# Patient Record
Sex: Female | Born: 1940 | Race: White | Hispanic: No | Smoking: Never smoker
Health system: Southern US, Community
[De-identification: ages and names within clinical notes are randomized; demographics above are authoritative.]

## PROBLEM LIST (undated history)

## (undated) DIAGNOSIS — M8580 Other specified disorders of bone density and structure, unspecified site: Secondary | ICD-10-CM

## (undated) DIAGNOSIS — H60543 Acute eczematoid otitis externa, bilateral: Secondary | ICD-10-CM

## (undated) DIAGNOSIS — K5790 Diverticulosis of intestine, part unspecified, without perforation or abscess without bleeding: Secondary | ICD-10-CM

## (undated) DIAGNOSIS — I63512 Cerebral infarction due to unspecified occlusion or stenosis of left middle cerebral artery: Secondary | ICD-10-CM

## (undated) DIAGNOSIS — Q2112 Patent foramen ovale: Secondary | ICD-10-CM

## (undated) DIAGNOSIS — M7062 Trochanteric bursitis, left hip: Secondary | ICD-10-CM

## (undated) DIAGNOSIS — M545 Low back pain, unspecified: Secondary | ICD-10-CM

## (undated) DIAGNOSIS — D071 Carcinoma in situ of vulva: Secondary | ICD-10-CM

## (undated) DIAGNOSIS — M79674 Pain in right toe(s): Secondary | ICD-10-CM

## (undated) DIAGNOSIS — I8390 Asymptomatic varicose veins of unspecified lower extremity: Secondary | ICD-10-CM

## (undated) DIAGNOSIS — I2510 Atherosclerotic heart disease of native coronary artery without angina pectoris: Secondary | ICD-10-CM

## (undated) DIAGNOSIS — I10 Essential (primary) hypertension: Secondary | ICD-10-CM

## (undated) DIAGNOSIS — M7061 Trochanteric bursitis, right hip: Secondary | ICD-10-CM

## (undated) DIAGNOSIS — F4329 Adjustment disorder with other symptoms: Secondary | ICD-10-CM

## (undated) DIAGNOSIS — G8929 Other chronic pain: Secondary | ICD-10-CM

## (undated) DIAGNOSIS — M1711 Unilateral primary osteoarthritis, right knee: Secondary | ICD-10-CM

## (undated) DIAGNOSIS — L659 Nonscarring hair loss, unspecified: Secondary | ICD-10-CM

## (undated) DIAGNOSIS — R0981 Nasal congestion: Secondary | ICD-10-CM

## (undated) DIAGNOSIS — L900 Lichen sclerosus et atrophicus: Secondary | ICD-10-CM

## (undated) DIAGNOSIS — J302 Other seasonal allergic rhinitis: Secondary | ICD-10-CM

## (undated) DIAGNOSIS — M792 Neuralgia and neuritis, unspecified: Secondary | ICD-10-CM

## (undated) DIAGNOSIS — M961 Postlaminectomy syndrome, not elsewhere classified: Secondary | ICD-10-CM

## (undated) DIAGNOSIS — F329 Major depressive disorder, single episode, unspecified: Secondary | ICD-10-CM

## (undated) DIAGNOSIS — I639 Cerebral infarction, unspecified: Secondary | ICD-10-CM

## (undated) DIAGNOSIS — L600 Ingrowing nail: Secondary | ICD-10-CM

## (undated) DIAGNOSIS — K5901 Slow transit constipation: Secondary | ICD-10-CM

## (undated) DIAGNOSIS — E559 Vitamin D deficiency, unspecified: Secondary | ICD-10-CM

## (undated) DIAGNOSIS — M5126 Other intervertebral disc displacement, lumbar region: Secondary | ICD-10-CM

## (undated) DIAGNOSIS — J3489 Other specified disorders of nose and nasal sinuses: Secondary | ICD-10-CM

## (undated) DIAGNOSIS — M79675 Pain in left toe(s): Secondary | ICD-10-CM

## (undated) DIAGNOSIS — C801 Malignant (primary) neoplasm, unspecified: Secondary | ICD-10-CM

## (undated) DIAGNOSIS — M1990 Unspecified osteoarthritis, unspecified site: Secondary | ICD-10-CM

## (undated) DIAGNOSIS — L710 Perioral dermatitis: Secondary | ICD-10-CM

## (undated) DIAGNOSIS — J3089 Other allergic rhinitis: Secondary | ICD-10-CM

## (undated) DIAGNOSIS — J189 Pneumonia, unspecified organism: Secondary | ICD-10-CM

## (undated) DIAGNOSIS — I214 Non-ST elevation (NSTEMI) myocardial infarction: Secondary | ICD-10-CM

## (undated) DIAGNOSIS — E7800 Pure hypercholesterolemia, unspecified: Secondary | ICD-10-CM

## (undated) DIAGNOSIS — F32A Depression, unspecified: Secondary | ICD-10-CM

## (undated) DIAGNOSIS — Q211 Atrial septal defect: Secondary | ICD-10-CM

## (undated) DIAGNOSIS — R278 Other lack of coordination: Secondary | ICD-10-CM

## (undated) DIAGNOSIS — F419 Anxiety disorder, unspecified: Secondary | ICD-10-CM

## (undated) DIAGNOSIS — M159 Polyosteoarthritis, unspecified: Secondary | ICD-10-CM

## (undated) DIAGNOSIS — Z8673 Personal history of transient ischemic attack (TIA), and cerebral infarction without residual deficits: Secondary | ICD-10-CM

## (undated) DIAGNOSIS — I739 Peripheral vascular disease, unspecified: Secondary | ICD-10-CM

## (undated) HISTORY — DX: Lichen sclerosus et atrophicus: L90.0

## (undated) HISTORY — DX: Personal history of transient ischemic attack (TIA), and cerebral infarction without residual deficits: Z86.73

## (undated) HISTORY — DX: Pain in left toe(s): M79.675

## (undated) HISTORY — DX: Pain in right toe(s): M79.674

## (undated) HISTORY — DX: Other specified disorders of bone density and structure, unspecified site: M85.80

## (undated) HISTORY — DX: Nonscarring hair loss, unspecified: L65.9

## (undated) HISTORY — DX: Unspecified osteoarthritis, unspecified site: M19.90

## (undated) HISTORY — DX: Cerebral infarction due to unspecified occlusion or stenosis of left middle cerebral artery: I63.512

## (undated) HISTORY — DX: Acute eczematoid otitis externa, bilateral: H60.543

## (undated) HISTORY — DX: Trochanteric bursitis, left hip: M70.62

## (undated) HISTORY — DX: Asymptomatic varicose veins of unspecified lower extremity: I83.90

## (undated) HISTORY — DX: Perioral dermatitis: L71.0

## (undated) HISTORY — DX: Other seasonal allergic rhinitis: J30.2

## (undated) HISTORY — DX: Other intervertebral disc displacement, lumbar region: M51.26

## (undated) HISTORY — DX: Vitamin D deficiency, unspecified: E55.9

## (undated) HISTORY — DX: Carcinoma in situ of vulva: D07.1

## (undated) HISTORY — DX: Postlaminectomy syndrome, not elsewhere classified: M96.1

## (undated) HISTORY — DX: Unilateral primary osteoarthritis, right knee: M17.11

## (undated) HISTORY — DX: Neuralgia and neuritis, unspecified: M79.2

## (undated) HISTORY — DX: Trochanteric bursitis, right hip: M70.61

## (undated) HISTORY — DX: Adjustment disorder with other symptoms: F43.29

## (undated) HISTORY — DX: Nasal congestion: R09.81

## (undated) HISTORY — DX: Ingrowing nail: L60.0

## (undated) HISTORY — DX: Other allergic rhinitis: J30.89

## (undated) HISTORY — DX: Polyosteoarthritis, unspecified: M15.9

## (undated) HISTORY — DX: Other lack of coordination: R27.8

## (undated) HISTORY — DX: Slow transit constipation: K59.01

## (undated) HISTORY — DX: Other specified disorders of nose and nasal sinuses: J34.89

## (undated) HISTORY — PX: BACK SURGERY: SHX140

---

## 1980-04-13 HISTORY — PX: VAGINAL HYSTERECTOMY: SUR661

## 1997-04-13 HISTORY — PX: CYST EXCISION: SHX5701

## 2012-04-13 DIAGNOSIS — I639 Cerebral infarction, unspecified: Secondary | ICD-10-CM

## 2012-04-13 HISTORY — DX: Cerebral infarction, unspecified: I63.9

## 2014-01-27 DIAGNOSIS — M8580 Other specified disorders of bone density and structure, unspecified site: Secondary | ICD-10-CM

## 2014-01-27 DIAGNOSIS — M1990 Unspecified osteoarthritis, unspecified site: Secondary | ICD-10-CM | POA: Insufficient documentation

## 2014-01-27 DIAGNOSIS — M51369 Other intervertebral disc degeneration, lumbar region without mention of lumbar back pain or lower extremity pain: Secondary | ICD-10-CM

## 2014-01-27 DIAGNOSIS — M5126 Other intervertebral disc displacement, lumbar region: Secondary | ICD-10-CM

## 2014-01-27 DIAGNOSIS — I8390 Asymptomatic varicose veins of unspecified lower extremity: Secondary | ICD-10-CM

## 2014-01-27 DIAGNOSIS — M792 Neuralgia and neuritis, unspecified: Secondary | ICD-10-CM

## 2014-01-27 DIAGNOSIS — F4329 Adjustment disorder with other symptoms: Secondary | ICD-10-CM

## 2014-01-27 DIAGNOSIS — M961 Postlaminectomy syndrome, not elsewhere classified: Secondary | ICD-10-CM

## 2014-01-27 DIAGNOSIS — L659 Nonscarring hair loss, unspecified: Secondary | ICD-10-CM | POA: Insufficient documentation

## 2014-01-27 DIAGNOSIS — L900 Lichen sclerosus et atrophicus: Secondary | ICD-10-CM

## 2014-01-27 DIAGNOSIS — M5136 Other intervertebral disc degeneration, lumbar region: Secondary | ICD-10-CM

## 2014-01-27 DIAGNOSIS — D071 Carcinoma in situ of vulva: Secondary | ICD-10-CM

## 2014-01-27 DIAGNOSIS — M1711 Unilateral primary osteoarthritis, right knee: Secondary | ICD-10-CM

## 2014-01-27 DIAGNOSIS — E559 Vitamin D deficiency, unspecified: Secondary | ICD-10-CM

## 2014-01-27 HISTORY — DX: Postlaminectomy syndrome, not elsewhere classified: M96.1

## 2014-01-27 HISTORY — DX: Asymptomatic varicose veins of unspecified lower extremity: I83.90

## 2014-01-27 HISTORY — DX: Other intervertebral disc degeneration, lumbar region: M51.36

## 2014-01-27 HISTORY — DX: Other intervertebral disc degeneration, lumbar region without mention of lumbar back pain or lower extremity pain: M51.369

## 2014-01-27 HISTORY — DX: Other intervertebral disc displacement, lumbar region: M51.26

## 2014-01-27 HISTORY — DX: Neuralgia and neuritis, unspecified: M79.2

## 2014-01-27 HISTORY — DX: Unspecified osteoarthritis, unspecified site: M19.90

## 2014-01-27 HISTORY — DX: Other specified disorders of bone density and structure, unspecified site: M85.80

## 2014-01-27 HISTORY — DX: Unilateral primary osteoarthritis, right knee: M17.11

## 2014-01-27 HISTORY — DX: Adjustment disorder with other symptoms: F43.29

## 2014-01-27 HISTORY — DX: Carcinoma in situ of vulva: D07.1

## 2014-01-27 HISTORY — DX: Vitamin D deficiency, unspecified: E55.9

## 2014-01-27 HISTORY — DX: Nonscarring hair loss, unspecified: L65.9

## 2014-01-27 HISTORY — DX: Lichen sclerosus et atrophicus: L90.0

## 2014-07-17 DIAGNOSIS — M159 Polyosteoarthritis, unspecified: Secondary | ICD-10-CM | POA: Insufficient documentation

## 2014-07-17 DIAGNOSIS — M7062 Trochanteric bursitis, left hip: Secondary | ICD-10-CM

## 2014-07-17 DIAGNOSIS — J3089 Other allergic rhinitis: Secondary | ICD-10-CM

## 2014-07-17 DIAGNOSIS — M7061 Trochanteric bursitis, right hip: Secondary | ICD-10-CM

## 2014-07-17 DIAGNOSIS — Z8673 Personal history of transient ischemic attack (TIA), and cerebral infarction without residual deficits: Secondary | ICD-10-CM

## 2014-07-17 DIAGNOSIS — J302 Other seasonal allergic rhinitis: Secondary | ICD-10-CM

## 2014-07-17 DIAGNOSIS — K5901 Slow transit constipation: Secondary | ICD-10-CM

## 2014-07-17 HISTORY — DX: Trochanteric bursitis, left hip: M70.62

## 2014-07-17 HISTORY — DX: Polyosteoarthritis, unspecified: M15.9

## 2014-07-17 HISTORY — DX: Other seasonal allergic rhinitis: J30.2

## 2014-07-17 HISTORY — DX: Slow transit constipation: K59.01

## 2014-07-17 HISTORY — DX: Personal history of transient ischemic attack (TIA), and cerebral infarction without residual deficits: Z86.73

## 2014-07-17 HISTORY — DX: Trochanteric bursitis, right hip: M70.61

## 2015-05-31 DIAGNOSIS — M159 Polyosteoarthritis, unspecified: Secondary | ICD-10-CM | POA: Diagnosis not present

## 2015-05-31 DIAGNOSIS — I10 Essential (primary) hypertension: Secondary | ICD-10-CM | POA: Diagnosis not present

## 2015-05-31 DIAGNOSIS — I83893 Varicose veins of bilateral lower extremities with other complications: Secondary | ICD-10-CM | POA: Diagnosis not present

## 2015-08-30 DIAGNOSIS — M150 Primary generalized (osteo)arthritis: Secondary | ICD-10-CM | POA: Diagnosis not present

## 2015-08-30 DIAGNOSIS — M5126 Other intervertebral disc displacement, lumbar region: Secondary | ICD-10-CM | POA: Diagnosis not present

## 2015-08-30 DIAGNOSIS — E559 Vitamin D deficiency, unspecified: Secondary | ICD-10-CM | POA: Diagnosis not present

## 2015-08-30 DIAGNOSIS — M159 Polyosteoarthritis, unspecified: Secondary | ICD-10-CM | POA: Diagnosis not present

## 2015-08-30 DIAGNOSIS — E7800 Pure hypercholesterolemia, unspecified: Secondary | ICD-10-CM | POA: Diagnosis not present

## 2015-08-30 DIAGNOSIS — I10 Essential (primary) hypertension: Secondary | ICD-10-CM | POA: Diagnosis not present

## 2015-09-12 DIAGNOSIS — E7800 Pure hypercholesterolemia, unspecified: Secondary | ICD-10-CM | POA: Diagnosis not present

## 2015-09-12 DIAGNOSIS — E559 Vitamin D deficiency, unspecified: Secondary | ICD-10-CM | POA: Diagnosis not present

## 2015-09-12 DIAGNOSIS — I10 Essential (primary) hypertension: Secondary | ICD-10-CM | POA: Diagnosis not present

## 2015-10-01 DIAGNOSIS — H25012 Cortical age-related cataract, left eye: Secondary | ICD-10-CM | POA: Diagnosis not present

## 2015-10-01 DIAGNOSIS — H25011 Cortical age-related cataract, right eye: Secondary | ICD-10-CM | POA: Diagnosis not present

## 2015-10-01 DIAGNOSIS — H2513 Age-related nuclear cataract, bilateral: Secondary | ICD-10-CM | POA: Diagnosis not present

## 2015-10-01 DIAGNOSIS — H353131 Nonexudative age-related macular degeneration, bilateral, early dry stage: Secondary | ICD-10-CM | POA: Diagnosis not present

## 2016-01-06 DIAGNOSIS — E782 Mixed hyperlipidemia: Secondary | ICD-10-CM | POA: Diagnosis not present

## 2016-01-06 DIAGNOSIS — M1711 Unilateral primary osteoarthritis, right knee: Secondary | ICD-10-CM | POA: Diagnosis not present

## 2016-01-06 DIAGNOSIS — M150 Primary generalized (osteo)arthritis: Secondary | ICD-10-CM | POA: Diagnosis not present

## 2016-01-06 DIAGNOSIS — I10 Essential (primary) hypertension: Secondary | ICD-10-CM | POA: Diagnosis not present

## 2016-01-06 DIAGNOSIS — M159 Polyosteoarthritis, unspecified: Secondary | ICD-10-CM | POA: Diagnosis not present

## 2016-01-06 DIAGNOSIS — Z23 Encounter for immunization: Secondary | ICD-10-CM | POA: Diagnosis not present

## 2016-02-05 DIAGNOSIS — D071 Carcinoma in situ of vulva: Secondary | ICD-10-CM | POA: Diagnosis not present

## 2016-02-05 DIAGNOSIS — Z01419 Encounter for gynecological examination (general) (routine) without abnormal findings: Secondary | ICD-10-CM | POA: Diagnosis not present

## 2016-02-06 DIAGNOSIS — J069 Acute upper respiratory infection, unspecified: Secondary | ICD-10-CM | POA: Diagnosis not present

## 2016-02-10 DIAGNOSIS — L821 Other seborrheic keratosis: Secondary | ICD-10-CM | POA: Diagnosis not present

## 2016-02-10 DIAGNOSIS — L570 Actinic keratosis: Secondary | ICD-10-CM | POA: Diagnosis not present

## 2016-02-10 DIAGNOSIS — R233 Spontaneous ecchymoses: Secondary | ICD-10-CM | POA: Diagnosis not present

## 2016-02-27 DIAGNOSIS — I63512 Cerebral infarction due to unspecified occlusion or stenosis of left middle cerebral artery: Secondary | ICD-10-CM

## 2016-02-27 DIAGNOSIS — R278 Other lack of coordination: Secondary | ICD-10-CM

## 2016-02-27 DIAGNOSIS — I10 Essential (primary) hypertension: Secondary | ICD-10-CM | POA: Diagnosis not present

## 2016-02-27 HISTORY — DX: Cerebral infarction due to unspecified occlusion or stenosis of left middle cerebral artery: I63.512

## 2016-02-27 HISTORY — DX: Other lack of coordination: R27.8

## 2016-03-17 DIAGNOSIS — Z1231 Encounter for screening mammogram for malignant neoplasm of breast: Secondary | ICD-10-CM | POA: Diagnosis not present

## 2016-04-26 DIAGNOSIS — H6121 Impacted cerumen, right ear: Secondary | ICD-10-CM | POA: Diagnosis not present

## 2016-04-26 DIAGNOSIS — J0100 Acute maxillary sinusitis, unspecified: Secondary | ICD-10-CM | POA: Diagnosis not present

## 2016-05-12 DIAGNOSIS — I10 Essential (primary) hypertension: Secondary | ICD-10-CM | POA: Diagnosis not present

## 2016-05-12 DIAGNOSIS — J40 Bronchitis, not specified as acute or chronic: Secondary | ICD-10-CM | POA: Diagnosis not present

## 2016-05-12 DIAGNOSIS — E559 Vitamin D deficiency, unspecified: Secondary | ICD-10-CM | POA: Diagnosis not present

## 2016-05-12 DIAGNOSIS — J329 Chronic sinusitis, unspecified: Secondary | ICD-10-CM | POA: Diagnosis not present

## 2016-05-12 DIAGNOSIS — J309 Allergic rhinitis, unspecified: Secondary | ICD-10-CM | POA: Diagnosis not present

## 2016-05-12 DIAGNOSIS — M159 Polyosteoarthritis, unspecified: Secondary | ICD-10-CM | POA: Diagnosis not present

## 2016-05-12 DIAGNOSIS — E782 Mixed hyperlipidemia: Secondary | ICD-10-CM | POA: Diagnosis not present

## 2016-06-03 DIAGNOSIS — J309 Allergic rhinitis, unspecified: Secondary | ICD-10-CM | POA: Diagnosis not present

## 2016-06-03 DIAGNOSIS — J320 Chronic maxillary sinusitis: Secondary | ICD-10-CM | POA: Diagnosis not present

## 2016-06-03 DIAGNOSIS — J4520 Mild intermittent asthma, uncomplicated: Secondary | ICD-10-CM | POA: Diagnosis not present

## 2016-06-17 DIAGNOSIS — I63512 Cerebral infarction due to unspecified occlusion or stenosis of left middle cerebral artery: Secondary | ICD-10-CM | POA: Diagnosis not present

## 2016-06-30 DIAGNOSIS — H60543 Acute eczematoid otitis externa, bilateral: Secondary | ICD-10-CM

## 2016-06-30 DIAGNOSIS — R0981 Nasal congestion: Secondary | ICD-10-CM

## 2016-06-30 DIAGNOSIS — J3489 Other specified disorders of nose and nasal sinuses: Secondary | ICD-10-CM

## 2016-06-30 HISTORY — DX: Acute eczematoid otitis externa, bilateral: H60.543

## 2016-06-30 HISTORY — DX: Nasal congestion: R09.81

## 2016-06-30 HISTORY — DX: Other specified disorders of nose and nasal sinuses: J34.89

## 2016-08-17 DIAGNOSIS — M159 Polyosteoarthritis, unspecified: Secondary | ICD-10-CM | POA: Diagnosis not present

## 2016-08-17 DIAGNOSIS — E559 Vitamin D deficiency, unspecified: Secondary | ICD-10-CM | POA: Diagnosis not present

## 2016-08-17 DIAGNOSIS — E782 Mixed hyperlipidemia: Secondary | ICD-10-CM | POA: Diagnosis not present

## 2016-08-17 DIAGNOSIS — I10 Essential (primary) hypertension: Secondary | ICD-10-CM | POA: Diagnosis not present

## 2016-08-17 DIAGNOSIS — M5126 Other intervertebral disc displacement, lumbar region: Secondary | ICD-10-CM | POA: Diagnosis not present

## 2016-10-06 DIAGNOSIS — H2513 Age-related nuclear cataract, bilateral: Secondary | ICD-10-CM | POA: Diagnosis not present

## 2016-10-06 DIAGNOSIS — H25013 Cortical age-related cataract, bilateral: Secondary | ICD-10-CM | POA: Diagnosis not present

## 2016-10-06 DIAGNOSIS — H5203 Hypermetropia, bilateral: Secondary | ICD-10-CM | POA: Diagnosis not present

## 2016-10-06 DIAGNOSIS — H353131 Nonexudative age-related macular degeneration, bilateral, early dry stage: Secondary | ICD-10-CM | POA: Diagnosis not present

## 2016-10-06 DIAGNOSIS — H524 Presbyopia: Secondary | ICD-10-CM | POA: Diagnosis not present

## 2016-10-06 DIAGNOSIS — H35371 Puckering of macula, right eye: Secondary | ICD-10-CM | POA: Diagnosis not present

## 2016-11-09 DIAGNOSIS — H15102 Unspecified episcleritis, left eye: Secondary | ICD-10-CM | POA: Diagnosis not present

## 2016-11-19 DIAGNOSIS — H15102 Unspecified episcleritis, left eye: Secondary | ICD-10-CM | POA: Diagnosis not present

## 2016-11-25 DIAGNOSIS — E559 Vitamin D deficiency, unspecified: Secondary | ICD-10-CM | POA: Diagnosis not present

## 2016-11-25 DIAGNOSIS — R7301 Impaired fasting glucose: Secondary | ICD-10-CM | POA: Diagnosis not present

## 2016-11-25 DIAGNOSIS — M159 Polyosteoarthritis, unspecified: Secondary | ICD-10-CM | POA: Diagnosis not present

## 2016-11-25 DIAGNOSIS — I10 Essential (primary) hypertension: Secondary | ICD-10-CM | POA: Diagnosis not present

## 2016-11-25 DIAGNOSIS — E782 Mixed hyperlipidemia: Secondary | ICD-10-CM | POA: Diagnosis not present

## 2016-12-02 DIAGNOSIS — L710 Perioral dermatitis: Secondary | ICD-10-CM

## 2016-12-02 HISTORY — DX: Perioral dermatitis: L71.0

## 2016-12-21 DIAGNOSIS — H15103 Unspecified episcleritis, bilateral: Secondary | ICD-10-CM | POA: Diagnosis not present

## 2016-12-21 DIAGNOSIS — H16223 Keratoconjunctivitis sicca, not specified as Sjogren's, bilateral: Secondary | ICD-10-CM | POA: Diagnosis not present

## 2017-01-20 DIAGNOSIS — L219 Seborrheic dermatitis, unspecified: Secondary | ICD-10-CM | POA: Diagnosis not present

## 2017-01-20 DIAGNOSIS — L719 Rosacea, unspecified: Secondary | ICD-10-CM | POA: Diagnosis not present

## 2017-01-28 DIAGNOSIS — M5126 Other intervertebral disc displacement, lumbar region: Secondary | ICD-10-CM | POA: Diagnosis not present

## 2017-01-28 DIAGNOSIS — Z23 Encounter for immunization: Secondary | ICD-10-CM | POA: Diagnosis not present

## 2017-01-28 DIAGNOSIS — E782 Mixed hyperlipidemia: Secondary | ICD-10-CM | POA: Diagnosis not present

## 2017-01-28 DIAGNOSIS — M150 Primary generalized (osteo)arthritis: Secondary | ICD-10-CM | POA: Diagnosis not present

## 2017-01-28 DIAGNOSIS — I10 Essential (primary) hypertension: Secondary | ICD-10-CM | POA: Diagnosis not present

## 2017-01-29 DIAGNOSIS — Z23 Encounter for immunization: Secondary | ICD-10-CM | POA: Diagnosis not present

## 2017-02-04 DIAGNOSIS — S61240A Puncture wound with foreign body of right index finger without damage to nail, initial encounter: Secondary | ICD-10-CM | POA: Diagnosis not present

## 2017-02-10 DIAGNOSIS — L719 Rosacea, unspecified: Secondary | ICD-10-CM | POA: Diagnosis not present

## 2017-02-10 DIAGNOSIS — L219 Seborrheic dermatitis, unspecified: Secondary | ICD-10-CM | POA: Diagnosis not present

## 2017-03-08 DIAGNOSIS — M810 Age-related osteoporosis without current pathological fracture: Secondary | ICD-10-CM | POA: Diagnosis not present

## 2017-03-08 DIAGNOSIS — Z90711 Acquired absence of uterus with remaining cervical stump: Secondary | ICD-10-CM | POA: Diagnosis not present

## 2017-03-08 DIAGNOSIS — D071 Carcinoma in situ of vulva: Secondary | ICD-10-CM | POA: Diagnosis not present

## 2017-03-08 DIAGNOSIS — Z01419 Encounter for gynecological examination (general) (routine) without abnormal findings: Secondary | ICD-10-CM | POA: Diagnosis not present

## 2017-03-17 DIAGNOSIS — M79674 Pain in right toe(s): Secondary | ICD-10-CM

## 2017-03-17 DIAGNOSIS — L600 Ingrowing nail: Secondary | ICD-10-CM

## 2017-03-17 DIAGNOSIS — M79675 Pain in left toe(s): Secondary | ICD-10-CM | POA: Diagnosis not present

## 2017-03-17 HISTORY — DX: Pain in right toe(s): M79.674

## 2017-03-17 HISTORY — DX: Ingrowing nail: L60.0

## 2017-04-20 DIAGNOSIS — H35371 Puckering of macula, right eye: Secondary | ICD-10-CM | POA: Diagnosis not present

## 2017-04-20 DIAGNOSIS — H353131 Nonexudative age-related macular degeneration, bilateral, early dry stage: Secondary | ICD-10-CM | POA: Diagnosis not present

## 2017-05-07 DIAGNOSIS — Z780 Asymptomatic menopausal state: Secondary | ICD-10-CM | POA: Diagnosis not present

## 2017-05-07 DIAGNOSIS — Z1231 Encounter for screening mammogram for malignant neoplasm of breast: Secondary | ICD-10-CM | POA: Diagnosis not present

## 2017-05-07 DIAGNOSIS — M810 Age-related osteoporosis without current pathological fracture: Secondary | ICD-10-CM | POA: Diagnosis not present

## 2017-05-07 DIAGNOSIS — M8589 Other specified disorders of bone density and structure, multiple sites: Secondary | ICD-10-CM | POA: Diagnosis not present

## 2017-05-13 ENCOUNTER — Other Ambulatory Visit: Payer: Self-pay

## 2017-05-13 ENCOUNTER — Emergency Department (HOSPITAL_BASED_OUTPATIENT_CLINIC_OR_DEPARTMENT_OTHER): Payer: PPO

## 2017-05-13 ENCOUNTER — Inpatient Hospital Stay (HOSPITAL_BASED_OUTPATIENT_CLINIC_OR_DEPARTMENT_OTHER)
Admission: EM | Admit: 2017-05-13 | Discharge: 2017-05-15 | DRG: 247 | Disposition: A | Discharge status: Home / Self Care | Payer: PPO | Attending: Cardiovascular Disease | Admitting: Cardiovascular Disease

## 2017-05-13 ENCOUNTER — Encounter (HOSPITAL_BASED_OUTPATIENT_CLINIC_OR_DEPARTMENT_OTHER): Payer: Self-pay | Admitting: *Deleted

## 2017-05-13 DIAGNOSIS — Z955 Presence of coronary angioplasty implant and graft: Secondary | ICD-10-CM | POA: Diagnosis not present

## 2017-05-13 DIAGNOSIS — K5730 Diverticulosis of large intestine without perforation or abscess without bleeding: Secondary | ICD-10-CM | POA: Diagnosis present

## 2017-05-13 DIAGNOSIS — R0789 Other chest pain: Secondary | ICD-10-CM | POA: Diagnosis not present

## 2017-05-13 DIAGNOSIS — Q211 Atrial septal defect: Secondary | ICD-10-CM | POA: Diagnosis not present

## 2017-05-13 DIAGNOSIS — F419 Anxiety disorder, unspecified: Secondary | ICD-10-CM | POA: Diagnosis present

## 2017-05-13 DIAGNOSIS — I10 Essential (primary) hypertension: Secondary | ICD-10-CM | POA: Diagnosis present

## 2017-05-13 DIAGNOSIS — Z79899 Other long term (current) drug therapy: Secondary | ICD-10-CM

## 2017-05-13 DIAGNOSIS — E7800 Pure hypercholesterolemia, unspecified: Secondary | ICD-10-CM | POA: Diagnosis not present

## 2017-05-13 DIAGNOSIS — I214 Non-ST elevation (NSTEMI) myocardial infarction: Principal | ICD-10-CM | POA: Diagnosis present

## 2017-05-13 DIAGNOSIS — Z8673 Personal history of transient ischemic attack (TIA), and cerebral infarction without residual deficits: Secondary | ICD-10-CM | POA: Diagnosis not present

## 2017-05-13 DIAGNOSIS — Z79891 Long term (current) use of opiate analgesic: Secondary | ICD-10-CM

## 2017-05-13 DIAGNOSIS — I340 Nonrheumatic mitral (valve) insufficiency: Secondary | ICD-10-CM | POA: Diagnosis not present

## 2017-05-13 DIAGNOSIS — E876 Hypokalemia: Secondary | ICD-10-CM | POA: Diagnosis not present

## 2017-05-13 DIAGNOSIS — Z885 Allergy status to narcotic agent status: Secondary | ICD-10-CM | POA: Diagnosis not present

## 2017-05-13 DIAGNOSIS — R079 Chest pain, unspecified: Secondary | ICD-10-CM | POA: Diagnosis not present

## 2017-05-13 DIAGNOSIS — Z7982 Long term (current) use of aspirin: Secondary | ICD-10-CM

## 2017-05-13 DIAGNOSIS — Q2112 Patent foramen ovale: Secondary | ICD-10-CM

## 2017-05-13 DIAGNOSIS — R109 Unspecified abdominal pain: Secondary | ICD-10-CM | POA: Diagnosis not present

## 2017-05-13 DIAGNOSIS — Z888 Allergy status to other drugs, medicaments and biological substances status: Secondary | ICD-10-CM

## 2017-05-13 DIAGNOSIS — I2510 Atherosclerotic heart disease of native coronary artery without angina pectoris: Secondary | ICD-10-CM | POA: Diagnosis not present

## 2017-05-13 DIAGNOSIS — R011 Cardiac murmur, unspecified: Secondary | ICD-10-CM | POA: Diagnosis not present

## 2017-05-13 DIAGNOSIS — E785 Hyperlipidemia, unspecified: Secondary | ICD-10-CM | POA: Diagnosis present

## 2017-05-13 DIAGNOSIS — I739 Peripheral vascular disease, unspecified: Secondary | ICD-10-CM

## 2017-05-13 DIAGNOSIS — K5790 Diverticulosis of intestine, part unspecified, without perforation or abscess without bleeding: Secondary | ICD-10-CM | POA: Diagnosis present

## 2017-05-13 DIAGNOSIS — Z90710 Acquired absence of both cervix and uterus: Secondary | ICD-10-CM

## 2017-05-13 DIAGNOSIS — R0602 Shortness of breath: Secondary | ICD-10-CM | POA: Diagnosis not present

## 2017-05-13 HISTORY — DX: Diverticulosis of intestine, part unspecified, without perforation or abscess without bleeding: K57.90

## 2017-05-13 HISTORY — DX: Non-ST elevation (NSTEMI) myocardial infarction: I21.4

## 2017-05-13 HISTORY — DX: Patent foramen ovale: Q21.12

## 2017-05-13 HISTORY — DX: Atherosclerotic heart disease of native coronary artery without angina pectoris: I25.10

## 2017-05-13 HISTORY — DX: Atrial septal defect: Q21.1

## 2017-05-13 HISTORY — DX: Other chronic pain: G89.29

## 2017-05-13 HISTORY — DX: Peripheral vascular disease, unspecified: I73.9

## 2017-05-13 HISTORY — DX: Unspecified osteoarthritis, unspecified site: M19.90

## 2017-05-13 HISTORY — DX: Cerebral infarction, unspecified: I63.9

## 2017-05-13 HISTORY — DX: Depression, unspecified: F32.A

## 2017-05-13 HISTORY — DX: Essential (primary) hypertension: I10

## 2017-05-13 HISTORY — DX: Low back pain: M54.5

## 2017-05-13 HISTORY — DX: Low back pain, unspecified: M54.50

## 2017-05-13 HISTORY — DX: Pneumonia, unspecified organism: J18.9

## 2017-05-13 HISTORY — DX: Major depressive disorder, single episode, unspecified: F32.9

## 2017-05-13 HISTORY — DX: Asymptomatic varicose veins of unspecified lower extremity: I83.90

## 2017-05-13 HISTORY — DX: Malignant (primary) neoplasm, unspecified: C80.1

## 2017-05-13 HISTORY — DX: Pure hypercholesterolemia, unspecified: E78.00

## 2017-05-13 HISTORY — DX: Anxiety disorder, unspecified: F41.9

## 2017-05-13 LAB — CBC
HCT: 42.4 % (ref 36.0–46.0)
HEMOGLOBIN: 13.9 g/dL (ref 12.0–15.0)
MCH: 28.1 pg (ref 26.0–34.0)
MCHC: 32.8 g/dL (ref 30.0–36.0)
MCV: 85.8 fL (ref 78.0–100.0)
PLATELETS: 252 10*3/uL (ref 150–400)
RBC: 4.94 MIL/uL (ref 3.87–5.11)
RDW: 15.2 % (ref 11.5–15.5)
WBC: 10.3 10*3/uL (ref 4.0–10.5)

## 2017-05-13 LAB — BASIC METABOLIC PANEL
ANION GAP: 10 (ref 5–15)
BUN: 16 mg/dL (ref 6–20)
CALCIUM: 10.5 mg/dL — AB (ref 8.9–10.3)
CO2: 26 mmol/L (ref 22–32)
Chloride: 102 mmol/L (ref 101–111)
Creatinine, Ser: 0.79 mg/dL (ref 0.44–1.00)
Glucose, Bld: 120 mg/dL — ABNORMAL HIGH (ref 65–99)
Potassium: 3.9 mmol/L (ref 3.5–5.1)
SODIUM: 138 mmol/L (ref 135–145)

## 2017-05-13 LAB — TROPONIN I: TROPONIN I: 18.96 ng/mL — AB (ref <0.03)

## 2017-05-13 LAB — MRSA PCR SCREENING: MRSA BY PCR: NEGATIVE

## 2017-05-13 LAB — I-STAT CG4 LACTIC ACID, ED: LACTIC ACID, VENOUS: 1.5 mmol/L (ref 0.5–1.9)

## 2017-05-13 MED ORDER — LORATADINE 10 MG PO TABS
10.0000 mg | ORAL_TABLET | Freq: Every day | ORAL | Status: DC
  Administered 2017-05-14 – 2017-05-15 (×2): 10 mg via ORAL
  Filled 2017-05-13 (×3): qty 1

## 2017-05-13 MED ORDER — MECLIZINE HCL 12.5 MG PO TABS
12.5000 mg | ORAL_TABLET | Freq: Three times a day (TID) | ORAL | Status: DC | PRN
  Filled 2017-05-13: qty 1

## 2017-05-13 MED ORDER — HYDROCODONE-ACETAMINOPHEN 5-325 MG PO TABS
1.0000 | ORAL_TABLET | Freq: Four times a day (QID) | ORAL | Status: DC | PRN
  Administered 2017-05-14 – 2017-05-15 (×5): 1 via ORAL
  Filled 2017-05-13 (×5): qty 1

## 2017-05-13 MED ORDER — HEPARIN (PORCINE) IN NACL 100-0.45 UNIT/ML-% IJ SOLN
1000.0000 [IU]/h | INTRAMUSCULAR | Status: DC
  Administered 2017-05-13: 900 [IU]/h via INTRAVENOUS
  Filled 2017-05-13: qty 250

## 2017-05-13 MED ORDER — ONDANSETRON HCL 4 MG/2ML IJ SOLN: 4.0000 mg | Freq: Four times a day (QID) | INTRAMUSCULAR | Status: DC | PRN

## 2017-05-13 MED ORDER — ASPIRIN 81 MG PO CHEW
162.0000 mg | CHEWABLE_TABLET | Freq: Once | ORAL | Status: AC
  Administered 2017-05-13: 162 mg via ORAL
  Filled 2017-05-13: qty 2

## 2017-05-13 MED ORDER — NITROGLYCERIN 0.4 MG SL SUBL
0.4000 mg | SUBLINGUAL_TABLET | SUBLINGUAL | Status: DC | PRN
  Administered 2017-05-15: 0.4 mg via SUBLINGUAL
  Filled 2017-05-13: qty 1

## 2017-05-13 MED ORDER — ASPIRIN EC 81 MG PO TBEC: 81.0000 mg | DELAYED_RELEASE_TABLET | Freq: Every day | ORAL | Status: DC

## 2017-05-13 MED ORDER — DIAZEPAM 5 MG PO TABS
10.0000 mg | ORAL_TABLET | Freq: Four times a day (QID) | ORAL | Status: DC | PRN
  Administered 2017-05-14: 23:00:00 5 mg via ORAL
  Filled 2017-05-13 (×2): qty 2

## 2017-05-13 MED ORDER — ROSUVASTATIN CALCIUM 20 MG PO TABS
40.0000 mg | ORAL_TABLET | Freq: Every day | ORAL | Status: DC
  Administered 2017-05-14 (×2): 40 mg via ORAL
  Filled 2017-05-13 (×2): qty 2

## 2017-05-13 MED ORDER — HEPARIN BOLUS VIA INFUSION
4000.0000 [IU] | Freq: Once | INTRAVENOUS | Status: AC
  Administered 2017-05-13: 4000 [IU] via INTRAVENOUS

## 2017-05-13 MED ORDER — AMLODIPINE BESYLATE 5 MG PO TABS
5.0000 mg | ORAL_TABLET | Freq: Every day | ORAL | Status: DC
  Administered 2017-05-14 – 2017-05-15 (×2): 5 mg via ORAL
  Filled 2017-05-13 (×3): qty 1

## 2017-05-13 MED ORDER — ASPIRIN EC 81 MG PO TBEC
81.0000 mg | DELAYED_RELEASE_TABLET | Freq: Every day | ORAL | Status: DC
  Administered 2017-05-15: 81 mg via ORAL
  Filled 2017-05-13: qty 1

## 2017-05-13 MED ORDER — METOPROLOL TARTRATE 25 MG PO TABS
25.0000 mg | ORAL_TABLET | Freq: Two times a day (BID) | ORAL | Status: DC
  Administered 2017-05-14: 25 mg via ORAL
  Filled 2017-05-13 (×3): qty 1

## 2017-05-13 MED ORDER — IOPAMIDOL (ISOVUE-370) INJECTION 76%
100.0000 mL | Freq: Once | INTRAVENOUS | Status: AC | PRN
  Administered 2017-05-13: 100 mL via INTRAVENOUS

## 2017-05-13 MED ORDER — ACETAMINOPHEN 325 MG PO TABS: 650.0000 mg | ORAL_TABLET | ORAL | Status: DC | PRN

## 2017-05-13 MED ORDER — ENALAPRIL MALEATE 20 MG PO TABS
20.0000 mg | ORAL_TABLET | Freq: Every day | ORAL | Status: DC
  Administered 2017-05-14 – 2017-05-15 (×2): 20 mg via ORAL
  Filled 2017-05-13 (×3): qty 1

## 2017-05-13 NOTE — ED Provider Notes (Signed)
[facility] EMERGENCY DEPARTMENT Provider Note   CSN: [id number] Arrival date & time: 05/13/17  1449     History   Chief Complaint Chief Complaint  Patient presents with  . Chest Pain    HPI Solace Manwarren is a 77 y.o. female.  HPI   Patient is a 77 year old female presenting with chest pain.  Patient felt short sharp chest pain today at noon.  It radiated to her back and radiated to her right arm.  Chest pain went down her right arm and associated with shortness of breath and diaphoresis.  Patient felt that she had to take off her shirt because she got so sweaty.  Patient called EMS because she got short of breath additionally.  She was taken to [facility].  They had a 5-hour wait so she waited in the waiting room for a little period of time and then drove over here to our emergency department to be seen.  Patient has just low-grade chest pain at this point.  However she is tachypneic on arrival.  Patient has no recent travel recent surgeries or estrogen use.  However I am concerned because the chest pain radiated to her back.  Past Medical History:  Diagnosis Date  . Anxiety   . Cancer (HCC)    cervical (pre-cancerous)  . Chronic pain   . Diverticulosis   . Stroke ([place])   . Varicose vein of leg     There are no active problems to display for this patient.   Past Surgical History:  Procedure Laterality Date  . ABDOMINAL HYSTERECTOMY      OB History    No data available       Home Medications    Prior to Admission medications   Medication Sig Start Date End Date Taking? Authorizing Provider  amLODipine (NORVASC) 5 MG tablet Take 5 mg by mouth daily.   Yes [provider]  aspirin EC 81 MG tablet Take 81 mg by mouth daily.   Yes [provider]  diazepam (VALIUM) 10 MG tablet Take 10 mg by mouth every 6 (six) hours as needed for anxiety.   Yes [provider]  enalapril (VASOTEC) 20 MG tablet Take 20 mg by mouth  daily.   Yes [provider]  HYDROcodone-acetaminophen (NORCO/VICODIN) 5-325 MG tablet Take 1 tablet by mouth every 6 (six) hours as needed for moderate pain.   Yes [provider]  loratadine (CLARITIN) 10 MG tablet Take 10 mg by mouth daily.   Yes [provider]  meclizine (ANTIVERT) 12.5 MG tablet Take 12.5 mg by mouth 3 (three) times daily as needed for dizziness.   Yes [provider]  metoprolol tartrate (LOPRESSOR) 25 MG tablet Take 25 mg by mouth 2 (two) times daily.   Yes [provider]  Naproxen (NAPROSYN PO) Take by mouth.   Yes [provider]    Family History History reviewed. No pertinent family history.  Social History Social History   Tobacco Use  . Smoking status: Never Smoker  . Smokeless tobacco: Never Used  Substance Use Topics  . Alcohol use: No    Frequency: Never  . Drug use: No     Allergies   Codeine   Review of Systems Review of Systems  Constitutional: Negative for activity change.  Respiratory: Negative for shortness of breath.   Cardiovascular: Positive for chest pain.  Gastrointestinal: Negative for abdominal pain.  All other systems reviewed and are negative.  Physical Exam Updated Vital Signs BP (!) 154/79   Pulse 86   Temp 98.2 F (36.8 C)   Resp (!) 22   Ht 5\' 3"  (1.6 m)   Wt 81.6 kg (180 lb)   SpO2 100%   BMI 31.89 kg/m   Physical Exam  Constitutional: She is oriented to person, place, and time. She appears well-developed and well-nourished.  HENT:  Head: Normocephalic and atraumatic.  Eyes: Right eye exhibits no discharge.  Cardiovascular: Normal rate, regular rhythm and normal heart sounds.  No murmur heard. Pulmonary/Chest: Effort normal and breath sounds normal. She has no wheezes. She has no rales.  Abdominal: Soft. She exhibits no distension. There is no tenderness.  Neurological: She is oriented to person, place, and time.  Skin: Skin is warm and dry. She  is not diaphoretic.  Psychiatric: She has a normal mood and affect.  Nursing note and vitals reviewed.    ED Treatments / Results  Labs (all labs ordered are listed, but only abnormal results are displayed) Labs Reviewed  BASIC METABOLIC PANEL  CBC  TROPONIN I  I-STAT CG4 LACTIC ACID, ED    EKG  EKG Interpretation  Date/Time:  Thursday May 13 2017 14:58:37 EST Ventricular Rate:  75 PR Interval:  150 QRS Duration: 78 QT Interval:  382 QTC Calculation: 426 R Axis:   54 Text Interpretation:  Normal sinus rhythm Normal ECG no sig signs of ischemia. Confirmed by Zenovia Jarred [phone]) on 05/13/2017 4:51:03 PM       Radiology No results found.  Procedures Procedures (including critical care time)  CRITICAL CARE Performed by: Gardiner Sleeper Total critical care time: 60 minutes Critical care time was exclusive of separately billable procedures and treating other patients. Critical care was necessary to treat or prevent imminent or life-threatening deterioration. Critical care was time spent personally by me on the following activities: development of treatment plan with patient and/or surrogate as well as nursing, discussions with consultants, evaluation of patient's response to treatment, examination of patient, obtaining history from patient or surrogate, ordering and performing treatments and interventions, ordering and review of laboratory studies, ordering and review of radiographic studies, pulse oximetry and re-evaluation of patient's condition.   Medications Ordered in ED Medications - No data to display   Initial Impression / Assessment and Plan / ED Course  I have reviewed the triage vital signs and the nursing notes.  Pertinent labs & imaging results that were available during my care of the patient were reviewed by me and considered in my medical decision making (see chart for details).    Patient is a 77 year old female presenting with chest  pain.  Patient felt short sharp chest pain today at noon.  It radiated to her back and radiated to her right arm.  Chest pain went down her right arm and associated with shortness of breath and diaphoresis.  Patient felt that she had to take off her shirt because she got so sweaty.  Patient called EMS because she got short of breath additionally.  She was taken to [facility].  They had a 5-hour wait so she waited in the waiting room for a little period of time and then drove over here to our emergency department to be seen.  Patient has just low-grade chest pain at this point.  However she is tachypneic on arrival.  Patient has no recent travel recent surgeries or estrogen use.  However I am concerned because the chest pain radiated to her  back.   4:53 PM Will get CT dissection given the chest pain radiating to the back and the acute onset.  Concerned that this is a cardiac ischemia versus dissection.  Patient has history of hypertension hyperlipidemia.  Will admit for chest pain rule out  Elevated troponin. EKG non ischemic.    6:33 PM Was waiting for CT dissection study to come back negative to start heparin.  Admit step down Olivia Mackie.     Final Clinical Impressions(s) / ED Diagnoses   Final diagnoses:  None    ED Discharge Orders    None       Macarthur Critchley, MD 05/13/17 2304

## 2017-05-13 NOTE — ED Notes (Signed)
Spoke with Marden Noble from [place] and he is dispatching a truck

## 2017-05-13 NOTE — ED Notes (Signed)
[phone], Jeral Fruit, granddaughter

## 2017-05-13 NOTE — ED Notes (Signed)
EDP decided not to give NTG at this time.

## 2017-05-13 NOTE — Hospital Discharge Follow-Up (Signed)
History and Physical   Patient ID: Theresa Prince, MRN: [id number], DOB: 01-01-1941   Date of Encounter: 05/13/2017, 10:42 PM  Primary Care Provider: Manfred Shirts, PA Cardiologist: NA Electrophysiologist:  NA  Chief Complaint:  CP  History of Present Illness: Theresa Prince is a 77 y.o. female w/ no documented cardiac hx, but + risk factors of HTN, dyslipidemia and h/o CVA per the chart presents to the ED in [facility] earlier this evening c/o CP. She said earlier today around noon she had sudden onset of a midsternal chest discomfort that felt like "someone punching me in the chest." the pain was associated with SOB and some discomfort in her R arm. It did radiate to her back. It kept getting worse and worse to the point she felt she needed to go to the ED. Nothing in particular made the pain better at first, but ultimately she got NTG at the ED in [facility] which fully relieved the discomfort. She has never had anything like this in the past. She has been able to exert herself w/o any limiting cardiac sx before today.trop was abnormal at the outside ED, up to almost 19. She is pain-free at the time of my exam/interview.  Past Medical History:  Diagnosis Date  . Anxiety   . Arthritis    "all over" (05/13/2017)  . Cancer (HCC)    cervical (pre-cancerous)  . Chronic lower back pain   . Depression   . Diverticulosis   . High cholesterol   . Hypertension   . NSTEMI (non-ST elevated myocardial infarction) ([place]) 05/13/2017  . Pneumonia    "twice" (05/13/2017)  . Stroke [facility]) 2014   "speech issues; mostly resolved; still have trouble pronouncing some words" (05/13/2017)  . Varicose vein of leg     Past Surgical History:  Procedure Laterality Date  . BACK SURGERY    . CYST EXCISION  1999   "took off my spine"  . VAGINAL HYSTERECTOMY  1982     Prior to Admission medications   Medication Sig Start Date End Date Taking? Authorizing Provider  amLODipine (NORVASC) 5 MG tablet Take 5  mg by mouth daily.   Yes [provider]  aspirin EC 81 MG tablet Take 81 mg by mouth daily.   Yes [provider]  diazepam (VALIUM) 10 MG tablet Take 10 mg by mouth every 6 (six) hours as needed for anxiety.   Yes [provider]  enalapril (VASOTEC) 20 MG tablet Take 20 mg by mouth daily.   Yes [provider]  HYDROcodone-acetaminophen (NORCO/VICODIN) 5-325 MG tablet Take 1 tablet by mouth every 6 (six) hours as needed for moderate pain.   Yes [provider]  loratadine (CLARITIN) 10 MG tablet Take 10 mg by mouth daily.   Yes [provider]  meclizine (ANTIVERT) 12.5 MG tablet Take 12.5 mg by mouth 3 (three) times daily as needed for dizziness.   Yes [provider]  metoprolol tartrate (LOPRESSOR) 25 MG tablet Take 25 mg by mouth 2 (two) times daily.   Yes [provider]  Naproxen (NAPROSYN PO) Take by mouth.   Yes [provider]     Allergies: Allergies  Allergen Reactions  . Codeine     Social History:  The patient  reports that  has never smoked. she has never used smokeless tobacco. She reports that she does not drink alcohol or use drugs.   Family History:  The patient's family history is not on file.  ROS:  Please see the history of present illness.     All other systems reviewed and negative.   Vital Signs: Blood pressure (!) 178/85, pulse 82, temperature 98.4 F (36.9 C), temperature source Oral, resp. rate 20, height 5\' 3"  (1.6 m), weight 81.4 kg (179 lb 7.3 oz), SpO2 100 %.  PHYSICAL EXAM: General:  Well nourished, well developed, in no acute distress HEENT: normal Lymph: no adenopathy Neck: no JVD Endocrine:  No thryomegaly Vascular: No carotid bruits; DP pulses 2+ bilaterally  Cardiac:  normal S1, S2; RRR; no murmur  Lungs:  clear to auscultation bilaterally, no wheezing, rhonchi or rales  Abd: soft, nontender, no hepatomegaly  Ext: no edema Musculoskeletal:  No deformities,  BUE and BLE strength normal and equal Skin: warm and dry  Neuro:  CNs 2-12 intact, no focal abnormalities noted Psych:  Normal affect   EKG:  05-13-17 NSR, no acute ST segment changes  Labs:   Lab Results  Component Value Date   WBC 10.3 05/13/2017   HGB 13.9 05/13/2017   HCT 42.4 05/13/2017   MCV 85.8 05/13/2017   PLT 252 05/13/2017   Recent Labs  Lab 05/13/17 1649  NA 138  K 3.9  CL 102  CO2 26  BUN 16  CREATININE 0.79  CALCIUM 10.5*  GLUCOSE 120*   Recent Labs    05/13/17 1649  TROPONINI 18.96*   Troponin (Point of Care Test) No results for input(s): TROPIPOC in the last 72 hours.  No results found for: CHOL, HDL, LDLCALC, TRIG No results found for: DDIMER  Radiology/Studies:  Ct Angio Chest/abd/pel For Dissection W And/or Wo Contrast  Result Date: 05/13/2017 CLINICAL DATA:  Patient with sudden onset chest pain radiating down the right arm. Shortness of breath. EXAM: CT ANGIOGRAPHY CHEST, ABDOMEN AND PELVIS TECHNIQUE: Multidetector CT imaging through the chest, abdomen and pelvis was performed using the standard protocol during bolus administration of intravenous contrast. Multiplanar reconstructed images and MIPs were obtained and reviewed to evaluate the vascular anatomy. CONTRAST:  114mL ISOVUE-370 IOPAMIDOL (ISOVUE-370) INJECTION 76% COMPARISON:  None. FINDINGS: CTA CHEST FINDINGS Cardiovascular: Normal heart size. No pericardial effusion. Noncontrast images demonstrate no peripheral high attenuation within the wall of the thoracic aorta to suggest acute intramural hematoma. Post-contrast images demonstrate no thoracic aortic dissection. Motion artifact limits evaluation of the aortic root. Scattered calcified atherosclerotic plaque involving the thoracic aorta as well as the origin of the great vessels. No evidence for central pulmonary embolus. Mediastinum/Nodes: No enlarged axillary, mediastinal or hilar lymphadenopathy. Small hiatal hernia. Lungs/Pleura: Central  airways are patent. Dependent atelectasis within the bilateral lower lobes. No pleural effusion or pneumothorax. Musculoskeletal: Thoracic spine degenerative changes. No aggressive or acute appearing osseous lesions. Review of the MIP images confirms the above findings. CTA ABDOMEN AND PELVIS FINDINGS VASCULAR Aorta: Normal caliber abdominal aorta. Peripheral calcified and noncalcified atherosclerotic plaque. No evidence for acute abdominal aortic dissection. Celiac: Patent.  Atherosclerotic narrowing of the splenic artery. SMA: Patent. Renals: Single bilateral renal arteries. Atherosclerotic narrowing at the origin of the left renal artery. IMA: Patent. Inflow: Patent Veins: Unremarkable Review of the MIP images confirms the above findings. NON-VASCULAR Hepatobiliary: Liver is normal in size and contour. No focal hepatic lesion is identified. Low-attenuation stones within the gallbladder lumen. No gallbladder wall thickening. No intrahepatic or extrahepatic biliary ductal dilatation. Pancreas: Unremarkable Spleen: Unremarkable Adrenals/Urinary Tract: Nodular thickening of the left adrenal gland measuring 11 mm, compatible with adenoma on noncontrast images. Normal right adrenal gland. Kidneys enhance  symmetrically with contrast. Urinary bladder is unremarkable. Stomach/Bowel: Large amount of stool within the rectum. Descending and sigmoid colonic diverticulosis. No CT evidence for acute diverticulitis. Normal appendix. Small hiatal hernia. Normal morphology of the stomach. No evidence for small bowel obstruction. No free fluid or free intraperitoneal air. Lymphatic: No retroperitoneal lymphadenopathy. Reproductive: Prior hysterectomy. Other: None. Musculoskeletal: Lumbar spine degenerative changes. Sclerotic lesion within the left aspect of the L1 vertebral body measuring 1.4 cm (image 99; series 5). Review of the MIP images confirms the above findings. IMPRESSION: 1. No evidence for acute thoracic or abdominal  aortic dissection. Scattered atherosclerotic changes as described above. 2. No acute process within the chest, abdomen or pelvis. 3. Probable bone island within the left aspect of the L1 vertebral body. Recommend comparison with outside priors if available. Electronically Signed   By: Lovey Newcomer M.D.   On: 05/13/2017 18:19      ASSESSMENT AND PLAN:   1. NSTEMI: pt is pain-free currently. Will cont to cycle enzymes. Cont heparin IV gtt. Plan for [facility] for further eval in the AM. Will get TTE as well.  2. HTN: BP high--will restart home meds and adjust PRN.  3. Dyslipidemia: will get FLP. Will go ahead w/ crestor 40mg  daily (pt not on statin prior). LDL goal <70  4. H/o CVA: stable from neuro standpoint  Signed,  Rudean Curt, MD  05/13/2017 10:42 PM

## 2017-05-13 NOTE — Progress Notes (Signed)
ANTICOAGULATION CONSULT NOTE - Initial Consult  Pharmacy Consult for heparin Indication: chest pain/ACS  Allergies  Allergen Reactions  . Codeine     Patient Measurements: Height: 5\' 3"  (160 cm) Weight: 180 lb (81.6 kg) IBW/kg (Calculated) : 52.4 Heparin Dosing Weight: 70.3kg  Vital Signs: Temp: 98.2 F (36.8 C) (01/31 1454) BP: 155/64 (01/31 1730) Pulse Rate: 77 (01/31 1730)  Labs: Recent Labs    05/13/17 1649  HGB 13.9  HCT 42.4  PLT 252  CREATININE 0.79  TROPONINI 18.96*    Estimated Creatinine Clearance: 59.6 mL/min (by C-G formula based on SCr of 0.79 mg/dL).   Medical History: Past Medical History:  Diagnosis Date  . Anxiety   . Cancer (HCC)    cervical (pre-cancerous)  . Chronic pain   . Diverticulosis   . Stroke ([place])   . Varicose vein of leg     Medications:  Infusions:  . heparin      Assessment: 63 yof presented to the ED with CP. Troponin significantly elevated. To start IV heparin. Baseline H/H and platelets are WNL. She is not on anticoagulation PTA. CT without any acute process.   Goal of Therapy:  Heparin level 0.3-0.7 units/ml Monitor platelets by anticoagulation protocol: Yes   Plan:  Heparin bolus 4000 units IV x 1 Heparin gtt 900 units/hr Check an 8 hr heparin level Daily heparin level and CB  Dustin Burrill, Rande Lawman 05/13/2017,6:37 PM

## 2017-05-13 NOTE — ED Notes (Signed)
[facility] requesting the we give NTG to pt. EDP made aware and expressed concern. EDP at pt bedside at this time.

## 2017-05-13 NOTE — ED Triage Notes (Addendum)
Pt c/o sudden onset of chest pain which  radiates down right arm, also c/o SOB, pt states she EMS took her to [facility] ED and she left d/t wait.

## 2017-05-13 NOTE — ED Notes (Addendum)
Pt reports a sudden dull pain in the center of chest that radiated to R arm and back. Pt reports associated ShOB, nausea, and diaphoresis. Mid chest is tender to palpation, and pain is made worse with deep inspiration. Pt noted to have tachypnea.  Pt denies hx of travel, recent sx, or estrogen use. 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

## 2017-05-13 NOTE — ED Notes (Signed)
[facility] arrived to transport pt 

## 2017-05-14 ENCOUNTER — Inpatient Hospital Stay (HOSPITAL_COMMUNITY): Payer: PPO

## 2017-05-14 ENCOUNTER — Encounter (HOSPITAL_COMMUNITY): Payer: Self-pay | Admitting: Interventional Cardiology

## 2017-05-14 ENCOUNTER — Encounter (HOSPITAL_COMMUNITY)
Admission: EM | Disposition: A | Discharge status: Home / Self Care | Payer: Self-pay | Source: Home / Self Care | Attending: Cardiology

## 2017-05-14 ENCOUNTER — Other Ambulatory Visit: Payer: Self-pay

## 2017-05-14 DIAGNOSIS — I340 Nonrheumatic mitral (valve) insufficiency: Secondary | ICD-10-CM

## 2017-05-14 HISTORY — PX: LEFT HEART CATH AND CORONARY ANGIOGRAPHY: CATH118249

## 2017-05-14 HISTORY — PX: CORONARY STENT INTERVENTION: CATH118234

## 2017-05-14 HISTORY — PX: CORONARY BALLOON ANGIOPLASTY: CATH118233

## 2017-05-14 LAB — BASIC METABOLIC PANEL
ANION GAP: 11 (ref 5–15)
BUN: 12 mg/dL (ref 6–20)
CALCIUM: 10 mg/dL (ref 8.9–10.3)
CO2: 22 mmol/L (ref 22–32)
CREATININE: 0.77 mg/dL (ref 0.44–1.00)
Chloride: 107 mmol/L (ref 101–111)
GFR calc non Af Amer: >60 mL/min (ref >60)
Glucose, Bld: 108 mg/dL — ABNORMAL HIGH (ref 65–99)
Potassium: 4.2 mmol/L (ref 3.5–5.1)
SODIUM: 140 mmol/L (ref 135–145)

## 2017-05-14 LAB — LIPID PANEL
Cholesterol: 250 mg/dL — ABNORMAL HIGH (ref 0–200)
HDL: 51 mg/dL (ref >40)
LDL CALC: 181 mg/dL — AB (ref 0–99)
TRIGLYCERIDES: 89 mg/dL (ref <150)
Total CHOL/HDL Ratio: 4.9 RATIO
VLDL: 18 mg/dL (ref 0–40)

## 2017-05-14 LAB — TSH: TSH: 1.711 u[IU]/mL (ref 0.350–4.500)

## 2017-05-14 LAB — CBC
HEMATOCRIT: 43.9 % (ref 36.0–46.0)
HEMOGLOBIN: 14.2 g/dL (ref 12.0–15.0)
MCH: 28.2 pg (ref 26.0–34.0)
MCHC: 32.3 g/dL (ref 30.0–36.0)
MCV: 87.3 fL (ref 78.0–100.0)
Platelets: 257 10*3/uL (ref 150–400)
RBC: 5.03 MIL/uL (ref 3.87–5.11)
RDW: 15.4 % (ref 11.5–15.5)
WBC: 10.3 10*3/uL (ref 4.0–10.5)

## 2017-05-14 LAB — ECHOCARDIOGRAM COMPLETE
Height: 63 in
Weight: 2849.6 oz

## 2017-05-14 LAB — PROTIME-INR
INR: 1.11
PROTHROMBIN TIME: 14.3 s (ref 11.4–15.2)

## 2017-05-14 LAB — TROPONIN I
Troponin I: 13.17 ng/mL (ref <0.03)
Troponin I: 9.22 ng/mL (ref <0.03)
Troponin I: 7.97 ng/mL (ref <0.03)

## 2017-05-14 LAB — HEMOGLOBIN A1C
Hgb A1c MFr Bld: 6 % — ABNORMAL HIGH (ref 4.8–5.6)
Mean Plasma Glucose: 125.5 mg/dL

## 2017-05-14 LAB — HEPARIN LEVEL (UNFRACTIONATED): HEPARIN UNFRACTIONATED: 0.27 [IU]/mL — AB (ref 0.30–0.70)

## 2017-05-14 LAB — POCT ACTIVATED CLOTTING TIME
ACTIVATED CLOTTING TIME: 274 s
ACTIVATED CLOTTING TIME: 340 s

## 2017-05-14 SURGERY — LEFT HEART CATH AND CORONARY ANGIOGRAPHY
Anesthesia: LOCAL

## 2017-05-14 MED ORDER — SODIUM CHLORIDE 0.9 % WEIGHT BASED INFUSION
1.0000 mL/kg/h | INTRAVENOUS | Status: DC
  Administered 2017-05-14: 1 mL/kg/h via INTRAVENOUS

## 2017-05-14 MED ORDER — IOPAMIDOL (ISOVUE-370) INJECTION 76%
INTRAVENOUS | Status: AC
  Filled 2017-05-14: qty 100

## 2017-05-14 MED ORDER — SODIUM CHLORIDE 0.9 % IV SOLN: 250.0000 mL | INTRAVENOUS | Status: DC | PRN

## 2017-05-14 MED ORDER — LIDOCAINE HCL 1 % IJ SOLN
INTRAMUSCULAR | Status: AC
  Filled 2017-05-14: qty 20

## 2017-05-14 MED ORDER — TIROFIBAN HCL IN NACL 5-0.9 MG/100ML-% IV SOLN
INTRAVENOUS | Status: AC
  Filled 2017-05-14: qty 100

## 2017-05-14 MED ORDER — TIROFIBAN HCL IN NACL 5-0.9 MG/100ML-% IV SOLN
INTRAVENOUS | Status: AC | PRN
  Administered 2017-05-14: 0.15 ug/kg/min via INTRAVENOUS

## 2017-05-14 MED ORDER — LABETALOL HCL 5 MG/ML IV SOLN: 10.0000 mg | INTRAVENOUS | Status: AC | PRN

## 2017-05-14 MED ORDER — METOPROLOL SUCCINATE ER 25 MG PO TB24
25.0000 mg | ORAL_TABLET | Freq: Every day | ORAL | Status: DC
  Administered 2017-05-14: 25 mg via ORAL
  Filled 2017-05-14 (×2): qty 1

## 2017-05-14 MED ORDER — VERAPAMIL HCL 2.5 MG/ML IV SOLN
INTRAVENOUS | Status: AC
  Filled 2017-05-14: qty 2

## 2017-05-14 MED ORDER — DOCUSATE SODIUM 100 MG PO CAPS
200.0000 mg | ORAL_CAPSULE | Freq: Every day | ORAL | Status: DC
  Administered 2017-05-14 (×2): 200 mg via ORAL
  Filled 2017-05-14 (×2): qty 2

## 2017-05-14 MED ORDER — LIDOCAINE HCL (PF) 1 % IJ SOLN
INTRAMUSCULAR | Status: DC | PRN
  Administered 2017-05-14: 2 mL

## 2017-05-14 MED ORDER — TICAGRELOR 90 MG PO TABS
90.0000 mg | ORAL_TABLET | Freq: Two times a day (BID) | ORAL | Status: DC
  Administered 2017-05-14 – 2017-05-15 (×2): 90 mg via ORAL
  Filled 2017-05-14 (×2): qty 1

## 2017-05-14 MED ORDER — SODIUM CHLORIDE 0.9% FLUSH
3.0000 mL | Freq: Two times a day (BID) | INTRAVENOUS | Status: DC
  Administered 2017-05-14: 3 mL via INTRAVENOUS

## 2017-05-14 MED ORDER — SODIUM CHLORIDE 0.9% FLUSH: 3.0000 mL | INTRAVENOUS | Status: DC | PRN

## 2017-05-14 MED ORDER — HEPARIN SODIUM (PORCINE) 1000 UNIT/ML IJ SOLN
INTRAMUSCULAR | Status: AC
  Filled 2017-05-14: qty 1

## 2017-05-14 MED ORDER — ACETAMINOPHEN 325 MG PO TABS: 650.0000 mg | ORAL_TABLET | ORAL | Status: DC | PRN

## 2017-05-14 MED ORDER — POLYVINYL ALCOHOL 1.4 % OP SOLN
1.0000 [drp] | OPHTHALMIC | Status: DC | PRN
  Filled 2017-05-14: qty 15

## 2017-05-14 MED ORDER — HEPARIN (PORCINE) IN NACL 2-0.9 UNIT/ML-% IJ SOLN
INTRAMUSCULAR | Status: AC
  Filled 2017-05-14: qty 1000

## 2017-05-14 MED ORDER — FENTANYL CITRATE (PF) 100 MCG/2ML IJ SOLN
INTRAMUSCULAR | Status: AC
  Filled 2017-05-14: qty 2

## 2017-05-14 MED ORDER — HEPARIN SODIUM (PORCINE) 1000 UNIT/ML IJ SOLN
INTRAMUSCULAR | Status: DC | PRN
  Administered 2017-05-14: 4000 [IU] via INTRAVENOUS
  Administered 2017-05-14: 3000 [IU] via INTRAVENOUS
  Administered 2017-05-14: 5000 [IU] via INTRAVENOUS

## 2017-05-14 MED ORDER — SODIUM CHLORIDE 0.9 % IV SOLN: INTRAVENOUS | Status: AC

## 2017-05-14 MED ORDER — TIROFIBAN (AGGRASTAT) BOLUS VIA INFUSION
INTRAVENOUS | Status: DC | PRN
  Administered 2017-05-14: 2020 ug via INTRAVENOUS

## 2017-05-14 MED ORDER — TICAGRELOR 90 MG PO TABS
ORAL_TABLET | ORAL | Status: DC | PRN
Start: 2017-05-14 — End: 2017-05-14
  Administered 2017-05-14: 180 mg via ORAL

## 2017-05-14 MED ORDER — SALINE SPRAY 0.65 % NA SOLN
1.0000 | NASAL | Status: DC | PRN
  Administered 2017-05-14: 1 via NASAL
  Filled 2017-05-14: qty 44

## 2017-05-14 MED ORDER — TIROFIBAN HCL IN NACL 5-0.9 MG/100ML-% IV SOLN
0.1500 ug/kg/min | INTRAVENOUS | Status: AC
Start: 2017-05-14 — End: 2017-05-14
  Filled 2017-05-14: qty 100

## 2017-05-14 MED ORDER — FENTANYL CITRATE (PF) 100 MCG/2ML IJ SOLN
INTRAMUSCULAR | Status: DC | PRN
  Administered 2017-05-14 (×2): 25 ug via INTRAVENOUS
  Administered 2017-05-14: 50 ug via INTRAVENOUS

## 2017-05-14 MED ORDER — ASPIRIN 81 MG PO CHEW: 81.0000 mg | CHEWABLE_TABLET | Freq: Every day | ORAL | Status: DC

## 2017-05-14 MED ORDER — HEPARIN (PORCINE) IN NACL 2-0.9 UNIT/ML-% IJ SOLN
INTRAMUSCULAR | Status: DC | PRN
  Administered 2017-05-14: 10:00:00

## 2017-05-14 MED ORDER — MIDAZOLAM HCL 2 MG/2ML IJ SOLN
INTRAMUSCULAR | Status: AC
  Filled 2017-05-14: qty 2

## 2017-05-14 MED ORDER — HYDRALAZINE HCL 20 MG/ML IJ SOLN: 5.0000 mg | INTRAMUSCULAR | Status: AC | PRN

## 2017-05-14 MED ORDER — SODIUM CHLORIDE 0.9 % WEIGHT BASED INFUSION
3.0000 mL/kg/h | INTRAVENOUS | Status: DC
  Administered 2017-05-14: 3 mL/kg/h via INTRAVENOUS

## 2017-05-14 MED ORDER — MIDAZOLAM HCL 2 MG/2ML IJ SOLN
INTRAMUSCULAR | Status: AC
Start: 2017-05-14 — End: 2017-05-14
  Filled 2017-05-14: qty 2

## 2017-05-14 MED ORDER — ANGIOPLASTY BOOK
Freq: Once | Status: AC
  Administered 2017-05-15: 02:00:00
  Filled 2017-05-14: qty 1

## 2017-05-14 MED ORDER — IOPAMIDOL (ISOVUE-370) INJECTION 76%
INTRAVENOUS | Status: DC | PRN
  Administered 2017-05-14: 120 mL via INTRA_ARTERIAL

## 2017-05-14 MED ORDER — NITROGLYCERIN 1 MG/10 ML FOR IR/CATH LAB
INTRA_ARTERIAL | Status: AC
  Filled 2017-05-14: qty 10

## 2017-05-14 MED ORDER — MIDAZOLAM HCL 2 MG/2ML IJ SOLN
INTRAMUSCULAR | Status: DC | PRN
  Administered 2017-05-14: 2 mg via INTRAVENOUS
  Administered 2017-05-14: 1 mg via INTRAVENOUS

## 2017-05-14 MED ORDER — ONDANSETRON HCL 4 MG/2ML IJ SOLN
4.0000 mg | Freq: Four times a day (QID) | INTRAMUSCULAR | Status: DC | PRN
  Administered 2017-05-15: 4 mg via INTRAVENOUS
  Filled 2017-05-14: qty 2

## 2017-05-14 MED ORDER — HEPARIN (PORCINE) IN NACL 2-0.9 UNIT/ML-% IJ SOLN
INTRAMUSCULAR | Status: DC | PRN
  Administered 2017-05-14: 10 mL via INTRA_ARTERIAL

## 2017-05-14 MED ORDER — ASPIRIN 81 MG PO CHEW
81.0000 mg | CHEWABLE_TABLET | ORAL | Status: AC
  Administered 2017-05-14: 81 mg via ORAL
  Filled 2017-05-14: qty 1

## 2017-05-14 SURGICAL SUPPLY — 22 items
BALLN SAPPHIRE 2.0X12 (BALLOONS) ×2
BALLN SAPPHIRE 2.5X15 (BALLOONS) ×2
BALLN SAPPHIRE ~~LOC~~ 2.75X15 (BALLOONS) ×2 IMPLANT
BALLN SAPPHIRE ~~LOC~~ 3.25X15 (BALLOONS) ×2 IMPLANT
BALLOON SAPPHIRE 2.0X12 (BALLOONS) ×1 IMPLANT
BALLOON SAPPHIRE 2.5X15 (BALLOONS) ×1 IMPLANT
CATH 5FR JL3.5 JR4 ANG PIG MP (CATHETERS) ×2 IMPLANT
CATH LAUNCHER 6FR EBU 3 (CATHETERS) ×2 IMPLANT
DEVICE RAD COMP TR BAND LRG (VASCULAR PRODUCTS) ×2 IMPLANT
GLIDESHEATH SLEND SS 6F .021 (SHEATH) ×2 IMPLANT
GUIDEWIRE INQWIRE 1.5J.035X260 (WIRE) ×1 IMPLANT
INQWIRE 1.5J .035X260CM (WIRE) ×2
KIT ENCORE 26 ADVANTAGE (KITS) ×2 IMPLANT
KIT HEART LEFT (KITS) ×2 IMPLANT
PACK CARDIAC CATHETERIZATION (CUSTOM PROCEDURE TRAY) ×2 IMPLANT
STENT SYNERGY DES 2.5X28 (Permanent Stent) ×2 IMPLANT
STENT SYNERGY DES 3X24 (Permanent Stent) ×2 IMPLANT
TRANSDUCER W/STOPCOCK (MISCELLANEOUS) ×2 IMPLANT
TUBING CIL FLEX 10 FLL-RA (TUBING) ×2 IMPLANT
VALVE GUARDIAN II ~~LOC~~ HEMO (MISCELLANEOUS) ×2 IMPLANT
WIRE ASAHI PROWATER 180CM (WIRE) ×2 IMPLANT
WIRE HI TORQ BMW 190CM (WIRE) ×2 IMPLANT

## 2017-05-14 NOTE — H&P (View-Only) (Signed)
Admission note from last night by Dr. Hassell Done is noted.  Pt admitted with NSTEMI.  Plans for left heart cath with possible PCI this am Labs reviewed by me. Troponin peak of 18.96  I have reviewed the risks, indications, and alternatives to cardiac catheterization, possible angioplasty, and stenting with the patient. Risks include but are not limited to bleeding, infection, vascular injury, stroke, myocardial infection, arrhythmia, kidney injury, radiation-related injury in the case of prolonged fluoroscopy use, emergency cardiac surgery, and death. The patient understands the risks of serious complication is 1-2 in 2072 with diagnostic cardiac cath and 1-2% or less with angioplasty/stenting. She agrees to proceed with cath  Theresa Prince  05/14/2017 7:44 AM

## 2017-05-14 NOTE — Plan of Care (Signed)
  Progressing Education: Understanding of cardiac disease, CV risk reduction, and recovery process will improve 05/14/2017 0208 - Progressing by Colonel Bald, RN Note Discussed elevated cardiac enzymes, possibility of a coronary artery blockage, and probable need for cardiac catheterization today.  Also discussed possibility that she would stay overnight following cardiac cath.

## 2017-05-14 NOTE — Progress Notes (Signed)
Admission note from last night by Dr. Hassell Done is noted.  Pt admitted with NSTEMI.  Plans for left heart cath with possible PCI this am Labs reviewed by me. Troponin peak of 18.96  I have reviewed the risks, indications, and alternatives to cardiac catheterization, possible angioplasty, and stenting with the patient. Risks include but are not limited to bleeding, infection, vascular injury, stroke, myocardial infection, arrhythmia, kidney injury, radiation-related injury in the case of prolonged fluoroscopy use, emergency cardiac surgery, and death. The patient understands the risks of serious complication is 1-2 in 2233 with diagnostic cardiac cath and 1-2% or less with angioplasty/stenting. She agrees to proceed with cath  Lauree Chandler  05/14/2017 7:44 AM

## 2017-05-14 NOTE — Progress Notes (Signed)
Progress Note  Patient Name: Theresa Prince Date of Encounter: 05/14/2017  Primary Cardiologist: Agustin Cree  Subjective   No chest pain this morning.   Inpatient Medications    Scheduled Meds: . amLODipine  5 mg Oral Daily  . aspirin  81 mg Oral Pre-Cath  . aspirin EC  81 mg Oral Daily  . docusate sodium  200 mg Oral Daily  . enalapril  20 mg Oral Daily  . loratadine  10 mg Oral Daily  . metoprolol tartrate  25 mg Oral BID  . rosuvastatin  40 mg Oral q1800  . sodium chloride flush  3 mL Intravenous Q12H   Continuous Infusions: . sodium chloride    . sodium chloride     Followed by  . sodium chloride    . heparin 1,000 Units/hr (05/14/17 0414)   PRN Meds: sodium chloride, acetaminophen, diazepam, HYDROcodone-acetaminophen, meclizine, nitroGLYCERIN, ondansetron (ZOFRAN) IV, polyvinyl alcohol, sodium chloride flush   Vital Signs    Vitals:   05/13/17 2102 05/13/17 2326 05/14/17 0025 05/14/17 0400  BP: (!) 178/85 (!) 171/76  (!) 161/69  Pulse: 82 86 81 74  Resp: 20 (!) 24 16 16   Temp: 98.4 F (36.9 C)  97.7 F (36.5 C) 97.9 F (36.6 C)  TempSrc: Oral  Oral Oral  SpO2: 100% 100% 99% 98%  Weight: 179 lb 7.3 oz (81.4 kg)   178 lb 1.6 oz (80.8 kg)  Height: 5\' 3"  (1.6 m)       Intake/Output Summary (Last 24 hours) at 05/14/2017 0743 Last data filed at 05/14/2017 0725 Gross per 24 hour  Intake 716.43 ml  Output 500 ml  Net 216.43 ml   Filed Weights   05/13/17 1453 05/13/17 2102 05/14/17 0400  Weight: 180 lb (81.6 kg) 179 lb 7.3 oz (81.4 kg) 178 lb 1.6 oz (80.8 kg)    Telemetry    SR - Personally Reviewed  ECG    SR - Personally Reviewed  Physical Exam   General: Well developed, well nourished, female appearing in no acute distress. Head: Normocephalic, atraumatic.  Neck: Supple without bruits, JVD. Lungs:  Resp regular and unlabored, CTA. Heart: RRR, S1, S2, no S3, S4, or murmur; no rub. Abdomen: Soft, non-tender, non-distended with normoactive bowel  sounds. No hepatomegaly. No rebound/guarding. No obvious abdominal masses. Extremities: No clubbing, cyanosis, edema. Distal pedal pulses are 2+ bilaterally. Neuro: Alert and oriented X 3. Moves all extremities spontaneously. Psych: Normal affect.  Labs    Chemistry Recent Labs  Lab 05/13/17 1649 05/14/17 0247  NA 138 140  K 3.9 4.2  CL 102 107  CO2 26 22  GLUCOSE 120* 108*  BUN 16 12  CREATININE 0.79 0.77  CALCIUM 10.5* 10.0  GFRNONAA >60 >60  GFRAA >60 >60  ANIONGAP 10 11     Hematology Recent Labs  Lab 05/13/17 1649 05/14/17 0247  WBC 10.3 10.3  RBC 4.94 5.03  HGB 13.9 14.2  HCT 42.4 43.9  MCV 85.8 87.3  MCH 28.1 28.2  MCHC 32.8 32.3  RDW 15.2 15.4  PLT 252 257    Cardiac Enzymes Recent Labs  Lab 05/13/17 1649 05/14/17 0247  TROPONINI 18.96* 13.17*   No results for input(s): TROPIPOC in the last 168 hours.   BNPNo results for input(s): BNP, PROBNP in the last 168 hours.   DDimer No results for input(s): DDIMER in the last 168 hours.    Radiology    Ct Angio Chest/abd/pel For Dissection W And/or Wo Contrast  Result  Date: 05/13/2017 CLINICAL DATA:  Patient with sudden onset chest pain radiating down the right arm. Shortness of breath. EXAM: CT ANGIOGRAPHY CHEST, ABDOMEN AND PELVIS TECHNIQUE: Multidetector CT imaging through the chest, abdomen and pelvis was performed using the standard protocol during bolus administration of intravenous contrast. Multiplanar reconstructed images and MIPs were obtained and reviewed to evaluate the vascular anatomy. CONTRAST:  112mL ISOVUE-370 IOPAMIDOL (ISOVUE-370) INJECTION 76% COMPARISON:  None. FINDINGS: CTA CHEST FINDINGS Cardiovascular: Normal heart size. No pericardial effusion. Noncontrast images demonstrate no peripheral high attenuation within the wall of the thoracic aorta to suggest acute intramural hematoma. Post-contrast images demonstrate no thoracic aortic dissection. Motion artifact limits evaluation of the  aortic root. Scattered calcified atherosclerotic plaque involving the thoracic aorta as well as the origin of the great vessels. No evidence for central pulmonary embolus. Mediastinum/Nodes: No enlarged axillary, mediastinal or hilar lymphadenopathy. Small hiatal hernia. Lungs/Pleura: Central airways are patent. Dependent atelectasis within the bilateral lower lobes. No pleural effusion or pneumothorax. Musculoskeletal: Thoracic spine degenerative changes. No aggressive or acute appearing osseous lesions. Review of the MIP images confirms the above findings. CTA ABDOMEN AND PELVIS FINDINGS VASCULAR Aorta: Normal caliber abdominal aorta. Peripheral calcified and noncalcified atherosclerotic plaque. No evidence for acute abdominal aortic dissection. Celiac: Patent.  Atherosclerotic narrowing of the splenic artery. SMA: Patent. Renals: Single bilateral renal arteries. Atherosclerotic narrowing at the origin of the left renal artery. IMA: Patent. Inflow: Patent Veins: Unremarkable Review of the MIP images confirms the above findings. NON-VASCULAR Hepatobiliary: Liver is normal in size and contour. No focal hepatic lesion is identified. Low-attenuation stones within the gallbladder lumen. No gallbladder wall thickening. No intrahepatic or extrahepatic biliary ductal dilatation. Pancreas: Unremarkable Spleen: Unremarkable Adrenals/Urinary Tract: Nodular thickening of the left adrenal gland measuring 11 mm, compatible with adenoma on noncontrast images. Normal right adrenal gland. Kidneys enhance symmetrically with contrast. Urinary bladder is unremarkable. Stomach/Bowel: Large amount of stool within the rectum. Descending and sigmoid colonic diverticulosis. No CT evidence for acute diverticulitis. Normal appendix. Small hiatal hernia. Normal morphology of the stomach. No evidence for small bowel obstruction. No free fluid or free intraperitoneal air. Lymphatic: No retroperitoneal lymphadenopathy. Reproductive: Prior  hysterectomy. Other: None. Musculoskeletal: Lumbar spine degenerative changes. Sclerotic lesion within the left aspect of the L1 vertebral body measuring 1.4 cm (image 99; series 5). Review of the MIP images confirms the above findings. IMPRESSION: 1. No evidence for acute thoracic or abdominal aortic dissection. Scattered atherosclerotic changes as described above. 2. No acute process within the chest, abdomen or pelvis. 3. Probable bone island within the left aspect of the L1 vertebral body. Recommend comparison with outside priors if available. Electronically Signed   By: Lovey Newcomer M.D.   On: 05/13/2017 18:19    Cardiac Studies   N/a  Patient Profile     77 y.o. female with PMH of HTN, HL and CVA who presented with chest pain to [facility] Found to have positive troponins and transferred to [facility] for further work up.   Assessment & Plan    1. NSTEMI: Troponin peaked at 18.9. Remains on IV heparin. No chest pain this morning. EKG non ischemic, given CRFs and trops planned for cath this morning.  -- continue IV heparin -- The patient understands that risks included but are not limited to stroke (1 in 1000), death (1 in 1000), kidney failure [usually temporary] (1 in 500), bleeding (1 in 200), allergic reaction [possibly serious] (1 in 200).  -- on ASA, statin, BB  2. HTN:  Blood pressures are elevated. May need to further titrate therapy post cath.   3. HL: LDL above goal at 18, started on high dose statin on admission.  4. Hx of CVA: Reports she was seen by cardiologist in the past after having this. No Afib reported.   Signed, Reino Bellis, NP  05/14/2017, 7:43 AM  Pager # [phone]   For questions or updates, please contact [place] Please consult [email or web address] for contact info under Cardiology/STEMI.  Agree with above. See my note from today.   Lauree Chandler 05/14/2017 2:16 PM

## 2017-05-14 NOTE — Progress Notes (Signed)
Consent signed and placed on chart

## 2017-05-14 NOTE — Progress Notes (Signed)
TR BAND REMOVAL  LOCATION:    right radial  DEFLATED PER PROTOCOL:    Yes.    TIME BAND OFF / DRESSING APPLIED:    1800  SITE UPON ARRIVAL:    Level 1  SITE AFTER BAND REMOVAL:    Level 1  CIRCULATION SENSATION AND MOVEMENT:    Within Normal Limits   Yes.    COMMENTS:   Checked at 38, 1900 with no change in assessment, dressing remains dry and intact

## 2017-05-14 NOTE — Interval H&P Note (Signed)
Cath Lab Visit (complete for each Cath Lab visit)  Clinical Evaluation Leading to the Procedure:   ACS: Yes.    Non-ACS:    Anginal Classification: CCS IV  Anti-ischemic medical therapy: Minimal Therapy (1 class of medications)  Non-Invasive Test Results: No non-invasive testing performed  Prior CABG: No previous CABG      History and Physical Interval Note:  05/14/2017 9:19 AM  Theresa Prince  has presented today for surgery, with the diagnosis of NSTEMI  The various methods of treatment have been discussed with the patient and family. After consideration of risks, benefits and other options for treatment, the patient has consented to  Procedure(s): LEFT HEART CATH AND CORONARY ANGIOGRAPHY (N/A) as a surgical intervention .  The patient's history has been reviewed, patient examined, no change in status, stable for surgery.  I have reviewed the patient's chart and labs.  Questions were answered to the patient's satisfaction.     Theresa Prince

## 2017-05-14 NOTE — Plan of Care (Signed)
  Activity: Ability to tolerate increased activity will improve 05/14/2017 0216 - Progressing by Colonel Bald, RN Note Cardiac rehab ordered per MD.

## 2017-05-14 NOTE — Progress Notes (Signed)
  Echocardiogram 2D Echocardiogram has been performed.  Theresa Prince 05/14/2017, 5:42 PM

## 2017-05-14 NOTE — Progress Notes (Signed)
Theresa Prince for Heparin Indication: chest pain/ACS  Allergies  Allergen Reactions  . Codeine     Patient Measurements: Height: 5\' 3"  (160 cm) Weight: 179 lb 7.3 oz (81.4 kg) IBW/kg (Calculated) : 52.4 Heparin Dosing Weight: 70.3kg  Vital Signs: Temp: 97.7 F (36.5 C) (02/01 0025) Temp Source: Oral (02/01 0025) BP: 171/76 (01/31 2326) Pulse Rate: 81 (02/01 0025)  Labs: Recent Labs    05/13/17 1649 05/14/17 0247  HGB 13.9 14.2  HCT 42.4 43.9  PLT 252 257  HEPARINUNFRC  --  0.27*  CREATININE 0.79  --   TROPONINI 18.96*  --     Estimated Creatinine Clearance: 59.5 mL/min (by C-G formula based on SCr of 0.79 mg/dL).   Medical History: Past Medical History:  Diagnosis Date  . Anxiety   . Arthritis    "all over" (05/13/2017)  . Cancer (HCC)    cervical (pre-cancerous)  . Chronic lower back pain   . Depression   . Diverticulosis   . High cholesterol   . Hypertension   . NSTEMI (non-ST elevated myocardial infarction) ([place]) 05/13/2017  . Pneumonia    "twice" (05/13/2017)  . Stroke [facility]) 2014   "speech issues; mostly resolved; still have trouble pronouncing some words" (05/13/2017)  . Varicose vein of leg     Medications:  Infusions:  . heparin 900 Units/hr (05/13/17 1850)    Assessment: 35 yof presented to the ED with CP. Troponin significantly elevated. To start IV heparin. Baseline H/H and platelets are WNL. She is not on anticoagulation PTA. CT without any acute process.   2/1 AM: initial heparin level is just below therapeutic range  Goal of Therapy:  Heparin level 0.3-0.7 units/ml Monitor platelets by anticoagulation protocol: Yes   Plan:  Inc heparin to 1000 units/hr 1200 HL  Narda Bonds, PharmD, BCPS Clinical Pharmacist Phone: [phone]

## 2017-05-14 NOTE — Progress Notes (Signed)
6 S/W  ALLEN  @ [employer] # [phone] OPT- 2    BRILINITA  90 MG BID     COVER- YES  CO-PAY- $ 40.00  TIER- 3 DRUG  PRIOR APPROVAL- NO    PREFERRED PHARMACY : [facility], [facility] AND [facility]

## 2017-05-15 ENCOUNTER — Encounter (HOSPITAL_COMMUNITY): Payer: Self-pay | Admitting: Physician Assistant

## 2017-05-15 DIAGNOSIS — Z955 Presence of coronary angioplasty implant and graft: Secondary | ICD-10-CM

## 2017-05-15 DIAGNOSIS — Q2112 Patent foramen ovale: Secondary | ICD-10-CM

## 2017-05-15 DIAGNOSIS — Q211 Atrial septal defect: Secondary | ICD-10-CM

## 2017-05-15 DIAGNOSIS — Z8673 Personal history of transient ischemic attack (TIA), and cerebral infarction without residual deficits: Secondary | ICD-10-CM

## 2017-05-15 DIAGNOSIS — K5790 Diverticulosis of intestine, part unspecified, without perforation or abscess without bleeding: Secondary | ICD-10-CM | POA: Diagnosis present

## 2017-05-15 DIAGNOSIS — I10 Essential (primary) hypertension: Secondary | ICD-10-CM | POA: Diagnosis present

## 2017-05-15 DIAGNOSIS — I2510 Atherosclerotic heart disease of native coronary artery without angina pectoris: Secondary | ICD-10-CM

## 2017-05-15 DIAGNOSIS — E7800 Pure hypercholesterolemia, unspecified: Secondary | ICD-10-CM

## 2017-05-15 DIAGNOSIS — E876 Hypokalemia: Secondary | ICD-10-CM

## 2017-05-15 DIAGNOSIS — I739 Peripheral vascular disease, unspecified: Secondary | ICD-10-CM

## 2017-05-15 LAB — HEPATIC FUNCTION PANEL
ALT: 20 U/L (ref 14–54)
AST: 69 U/L — ABNORMAL HIGH (ref 15–41)
Albumin: 3.2 g/dL — ABNORMAL LOW (ref 3.5–5.0)
Alkaline Phosphatase: 63 U/L (ref 38–126)
BILIRUBIN DIRECT: 0.1 mg/dL (ref 0.1–0.5)
BILIRUBIN INDIRECT: 0.7 mg/dL (ref 0.3–0.9)
TOTAL PROTEIN: 5.6 g/dL — AB (ref 6.5–8.1)
Total Bilirubin: 0.8 mg/dL (ref 0.3–1.2)

## 2017-05-15 LAB — CBC
HCT: 38.2 % (ref 36.0–46.0)
Hemoglobin: 12.3 g/dL (ref 12.0–15.0)
MCH: 27.9 pg (ref 26.0–34.0)
MCHC: 32.2 g/dL (ref 30.0–36.0)
MCV: 86.6 fL (ref 78.0–100.0)
PLATELETS: 234 10*3/uL (ref 150–400)
RBC: 4.41 MIL/uL (ref 3.87–5.11)
RDW: 15.6 % — ABNORMAL HIGH (ref 11.5–15.5)
WBC: 10.2 10*3/uL (ref 4.0–10.5)

## 2017-05-15 LAB — BASIC METABOLIC PANEL
Anion gap: 10 (ref 5–15)
BUN: 11 mg/dL (ref 6–20)
CO2: 21 mmol/L — AB (ref 22–32)
CREATININE: 0.79 mg/dL (ref 0.44–1.00)
Calcium: 9 mg/dL (ref 8.9–10.3)
Chloride: 106 mmol/L (ref 101–111)
GFR calc Af Amer: >60 mL/min (ref >60)
GFR calc non Af Amer: >60 mL/min (ref >60)
GLUCOSE: 121 mg/dL — AB (ref 65–99)
Potassium: 3.3 mmol/L — ABNORMAL LOW (ref 3.5–5.1)
Sodium: 137 mmol/L (ref 135–145)

## 2017-05-15 MED ORDER — NITROGLYCERIN 0.4 MG SL SUBL: 0.4000 mg | SUBLINGUAL_TABLET | SUBLINGUAL | 3 refills | Status: DC | PRN

## 2017-05-15 MED ORDER — ALUM & MAG HYDROXIDE-SIMETH 200-200-20 MG/5ML PO SUSP
30.0000 mL | Freq: Once | ORAL | Status: AC
  Administered 2017-05-15: 02:00:00 30 mL via ORAL

## 2017-05-15 MED ORDER — POTASSIUM CHLORIDE CRYS ER 20 MEQ PO TBCR
40.0000 meq | EXTENDED_RELEASE_TABLET | Freq: Once | ORAL | Status: AC
  Administered 2017-05-15: 40 meq via ORAL
  Filled 2017-05-15: qty 2

## 2017-05-15 MED ORDER — TICAGRELOR 90 MG PO TABS: 90.0000 mg | ORAL_TABLET | Freq: Two times a day (BID) | ORAL | 3 refills | Status: DC

## 2017-05-15 MED ORDER — ROSUVASTATIN CALCIUM 40 MG PO TABS
40.0000 mg | ORAL_TABLET | Freq: Every evening | ORAL | 6 refills | Status: DC
Start: 2017-05-15 — End: 2017-08-26

## 2017-05-15 NOTE — Progress Notes (Signed)
CARDIAC REHAB PHASE I   PRE:  Rate/Rhythm: 73 SR  BP:  Supine: 133/52  Sitting:   Standing:    SaO2: 96%RA  MODE:  Ambulation: 200 ft   POST:  Rate/Rhythm: 93 SR  BP:  Supine:   Sitting: 155/74  Standing:    SaO2: 96%RA 0735-0840 Pt walked 200 ft with rolling walker and minimal asst with very slow pace. C/o knees and legs feeling weak because she has not been walking. Encouraged her to use walker at home and to walk with someone until stronger. Granddaughter in room and to be with her the next few days. No CP. MI education completed with pt and granddaughter. Pt will need reinforcement with ed. Encouraged her to read written materials as I turned the pages down on booklet of what was reviewed. Stressed importance of brilinta with stent. Pt has brilinta card. Reviewed MI restrictions, NTG use, walking for ex, watching carbs and heart healthy food choices and CRP 2. Will refer to [facility] program. Discussed with pt that her A1c is 6 and needs to watch carbs. Pt stated she has walker available at home. Many questions by pt were answered and information reinforced.   Graylon Good, RN BSN  05/15/2017 9:09 AM

## 2017-05-15 NOTE — Care Management Note (Signed)
Case Management Note  Patient Details  Name: Theresa Prince MRN: [id number] Date of Birth: 1941/04/05  Subjective/Objective:  From home, pta indep, s/p coronary stent intervention, NCM gave patient the 30 day savings card, and her co pay amt of 40.0 for refills.                   Action/Plan: DC home today.  Expected Discharge Date:  05/15/17               Expected Discharge Plan:  Home/Self Care  In-House Referral:     Discharge planning Services  CM Consult, Medication Assistance  Post Acute Care Choice:    Choice offered to:     DME Arranged:    DME Agency:     HH Arranged:    HH Agency:     Status of Service:  Completed, signed off  If discussed at [employer] of Stay Meetings, dates discussed:    Additional Comments:  Zenon Mayo, RN 05/15/2017, 8:33 AM

## 2017-05-15 NOTE — Progress Notes (Signed)
Went into check on patient, she says its much better, only rates discomfort 2 out of 10.  She is currently resting quietly.  Will continue to monitor. BP139/55, HR 70, O2 sats 99% on 2L

## 2017-05-15 NOTE — Progress Notes (Addendum)
Called to room by patient c/o indigestion burning down to stomach and chest pressure 6/10, O2 applied, O2 sat 97%, BP 163/60, HR 70. Also complained that she was short of breath. Gave Maalox and 1 NTG will continue to monitor. Also gave a coke

## 2017-05-15 NOTE — Discharge Summary (Signed)
Discharge Summary    Patient ID: Theresa Prince,  MRN: [id number], DOB/AGE: Feb 26, 1941 77 y.o.  Admit date: 05/13/2017 Discharge date: 05/15/2017  Primary Care Provider: Manfred Shirts Primary Cardiologist: New this admission; first seen by Dr. Angelena Form  Discharge Diagnoses    Principal Problem:   NSTEMI (non-ST elevated myocardial infarction) [facility]) Active Problems:   CAD in native artery   Diverticulosis   High cholesterol   Hypertension   PFO (patent foramen ovale)   PAD (peripheral artery disease) [facility])    Diagnostic Studies/Procedures    LHC 05/14/17 Procedures   CORONARY BALLOON ANGIOPLASTY  CORONARY STENT INTERVENTION  LEFT HEART CATH AND CORONARY ANGIOGRAPHY  Conclusion     Prox RCA to Dist RCA lesion is 25% stenosed.  RPDA lesion is 75% stenosed.  Ost 2nd Mrg to 2nd Mrg lesion is 95% stenosed. This was treated with 2.0 balloon PTCA. Post intervention, there is a 10% residual stenosis.  Prox Cx to Mid Cx lesion is 90% stenosed.  A drug-eluting stent was successfully placed using a STENT SYNERGY DES 2.5X28.  Post intervention, there is a 0% residual stenosis.  Prox LAD lesion is 25% stenosed.  Mid LAD lesion is 80% stenosed.  A drug-eluting stent was successfully placed using a STENT SYNERGY DES 3X24.  Post intervention, there is a 0% residual stenosis.  Mid LAD to Dist LAD lesion is 50% stenosed.  Dist LAD lesion is 80% stenosed.  The left ventricular systolic function is normal.  LV end diastolic pressure is normal.  The left ventricular ejection fraction is 55-65% by visual estimate.  There is no aortic valve stenosis.   Distal LAD disease to be managed medically.    Successful two vessel PCI to the circumflex/OM and LAD.   Continue dual antiplatelet therapy for at least one year, along with aggressive secondary prevention.     2D Echo 05/14/17 Study Conclusions  - Left ventricle: The cavity size was normal. Wall thickness was  increased in a pattern of mild LVH. Systolic function was   vigorous. The estimated ejection fraction was in the range of 65%   to 70%. Wall motion was normal; there were no regional wall   motion abnormalities. Doppler parameters are consistent with   abnormal left ventricular relaxation (grade 1 diastolic   dysfunction). The E/e&' ratio is between 8-15, suggesting   indeterminate LV filling pressure. - Aortic valve: Trileaflet. Sclerosis without stenosis. There was   no regurgitation. - Mitral valve: Mildly thickened leaflets . There was mild   regurgitation. - Left atrium: The atrium was mildly dilated. - Atrial septum: There was a patent foramen ovale. - Tricuspid valve: There was mild regurgitation. - Pulmonary arteries: PA peak pressure: 39 mm Hg (S). - Inferior vena cava: The vessel was normal in size. The   respirophasic diameter changes were in the normal range (>= 50%),   consistent with normal central venous pressure.  Impressions:  - LVEF 65-70%, mild LVH, normal wall motion, grade 1 DD,   indeterminate LV filling pressure, aortic valve sclerosis, mild   MR, mild LAE, small PFO by color doppler, mild TR, RVSP 39 mmHg,   normal IVC.   _____________     History of Present Illness     Theresa Prince is a 77 y.o. female with history of HTN, dyslipidemia, CVA 2014, cervical pre-cancer, diverticulosis, vericose veins who presented to [facility] 05/13/2017 with chest pain, dyspnea, R arm pain, and ruled in for NSTEMI. She was seen at the ED  at [place] and troponin was up to almost 19. She was subsequently transferred to [facility] for further management on cardiology service.  Hospital Course    1. NSTEMI/CAD: troponin peak was 18.96. She was placed on heparin drip, continued on ASA/BB, and started on Crestor. Cardiac cath 05/14/17 showed multivessel disease with 25% Prox-distal RCA, 75% rPDA, 95% OM2 (treated with PTCA), 90% prox-mid Cx (treated with DES), 25% prox LAD< 80% mLAD  (treated with DES), 50% mid-distal LAD, 80% distal LAD, LVEDP normal, LVEF 55-65%. She was started on DAPT to continue at least one year. Her residual disease will be treated medically. She tolerated cath well without complication. She had some residual discomfort last night but this has resolved this AM. 2D Echo yesterday showed mild LVH, EF 65-70%, grade 1 DD, mild MR, mild LAE, +PFO, mild TR, RVSP 98mmHg.  2. HTN: BP high--will restart home meds and adjust PRN.  3. Dyslipidemia: LDL markedly elevated at 181. Baseline LFTs added today prior to DC, pending at time of discharge. Crestor added this admission. If the patient is tolerating statin at time of follow-up appointment, would consider rechecking liver function/lipid panel in 6-8 weeks.  4. H/o CVA: stable from neuro standpoint this admission. PFO was noted on echocardiogram. She is now on DAPT. Her stroke workup was not previously at this institution but further consideration for treatment of her PFO can be revisited as OP per d/w Dr. Bronson Ing.  5. Hypokalemia: admitting K was 4.2, down to 3.3 today but in the setting of being NPO for part of the day yesterday for cath. KCl 79meq was given. Can consider recheck as OP.  6. PAD - CT abd pelvis in ER ruled out dissection but showed peripheral calcified and noncalcified atherosclerotic plaque with atherosclerotic narrowing of the splenic artery and atherosclerotic narrowing at the origin of the left renal artery.  Dr. Bronson Ing has seen and examined the patient today and feels she is stable for discharge. New meds include Brilinta, Crestor and PRN NTG. She had 2 types of metoprolol listed on med list and was asked to stop Tartrate and continue Succinate. She was also cautioned on use of NSAIDS. I have sent a message to our office's scheduling team requesting a TOC follow-up appointment, and our office will call the patient with this information.  _____________  Discharge Vitals Vital Signs.  BP (!) 133/52 (BP Location: Left Arm)   Pulse 80   Temp 97.7 F (36.5 C) (Oral)   Resp 17   Ht 5\' 3"  (1.6 m)   Wt 179 lb 4.8 oz (81.3 kg)   SpO2 96%   BMI 31.76 kg/m  General: Well developed, well nourished F, in no acute distress. Head: Normocephalic, atraumatic, sclera non-icteric, no xanthomas, nares are without discharge. Neck: Negative for carotid bruits. JVP not elevated. Lungs: Lungs diminished throughout but no wheezes, rales, or rhonchi. Breathing is unlabored. Heart: RRR S1 S2 1/6 basal SEM. No rubs or gallops.  Abdomen: Soft, non-tender, non-distended with normoactive bowel sounds. No rebound/guarding. Extremities: No clubbing or cyanosis. No edema. Distal pedal pulses are 2+ and equal bilaterally. Mind tenderness at right radial site but otherwise no hematoma or ecchymosis; good pulse. Neuro: Alert and oriented X 3. Moves all extremities spontaneously. Psych:  Responds to questions appropriately with a normal affect. Filed Weights   05/13/17 2102 05/14/17 0400 05/15/17 0425  Weight: 179 lb 7.3 oz (81.4 kg) 178 lb 1.6 oz (80.8 kg) 179 lb 4.8 oz (81.3 kg)  Labs & Radiologic Studies    CBC Recent Labs    05/14/17 0247 05/15/17 0309  WBC 10.3 10.2  HGB 14.2 12.3  HCT 43.9 38.2  MCV 87.3 86.6  PLT 257 983   Basic Metabolic Panel Recent Labs    05/14/17 0247 05/15/17 0309  NA 140 137  K 4.2 3.3*  CL 107 106  CO2 22 21*  GLUCOSE 108* 121*  BUN 12 11  CREATININE 0.77 0.79  CALCIUM 10.0 9.0   Cardiac Enzymes Recent Labs    05/14/17 0247 05/14/17 0821 05/14/17 1359  TROPONINI 13.17* 9.22* 7.97*   Hemoglobin A1C Recent Labs    05/14/17 0247  HGBA1C 6.0*   Fasting Lipid Panel Recent Labs    05/14/17 0247  CHOL 250*  HDL 51  LDLCALC 181*  TRIG 89  CHOLHDL 4.9   Thyroid Function Tests Recent Labs    05/14/17 0247  TSH 1.711   _____________  Ct Angio Chest/abd/pel For Dissection W And/or Wo Contrast  Result Date: 05/13/2017 CLINICAL  DATA:  Patient with sudden onset chest pain radiating down the right arm. Shortness of breath. EXAM: CT ANGIOGRAPHY CHEST, ABDOMEN AND PELVIS TECHNIQUE: Multidetector CT imaging through the chest, abdomen and pelvis was performed using the standard protocol during bolus administration of intravenous contrast. Multiplanar reconstructed images and MIPs were obtained and reviewed to evaluate the vascular anatomy. CONTRAST:  156mL ISOVUE-370 IOPAMIDOL (ISOVUE-370) INJECTION 76% COMPARISON:  None. FINDINGS: CTA CHEST FINDINGS Cardiovascular: Normal heart size. No pericardial effusion. Noncontrast images demonstrate no peripheral high attenuation within the wall of the thoracic aorta to suggest acute intramural hematoma. Post-contrast images demonstrate no thoracic aortic dissection. Motion artifact limits evaluation of the aortic root. Scattered calcified atherosclerotic plaque involving the thoracic aorta as well as the origin of the great vessels. No evidence for central pulmonary embolus. Mediastinum/Nodes: No enlarged axillary, mediastinal or hilar lymphadenopathy. Small hiatal hernia. Lungs/Pleura: Central airways are patent. Dependent atelectasis within the bilateral lower lobes. No pleural effusion or pneumothorax. Musculoskeletal: Thoracic spine degenerative changes. No aggressive or acute appearing osseous lesions. Review of the MIP images confirms the above findings. CTA ABDOMEN AND PELVIS FINDINGS VASCULAR Aorta: Normal caliber abdominal aorta. Peripheral calcified and noncalcified atherosclerotic plaque. No evidence for acute abdominal aortic dissection. Celiac: Patent.  Atherosclerotic narrowing of the splenic artery. SMA: Patent. Renals: Single bilateral renal arteries. Atherosclerotic narrowing at the origin of the left renal artery. IMA: Patent. Inflow: Patent Veins: Unremarkable Review of the MIP images confirms the above findings. NON-VASCULAR Hepatobiliary: Liver is normal in size and contour. No focal  hepatic lesion is identified. Low-attenuation stones within the gallbladder lumen. No gallbladder wall thickening. No intrahepatic or extrahepatic biliary ductal dilatation. Pancreas: Unremarkable Spleen: Unremarkable Adrenals/Urinary Tract: Nodular thickening of the left adrenal gland measuring 11 mm, compatible with adenoma on noncontrast images. Normal right adrenal gland. Kidneys enhance symmetrically with contrast. Urinary bladder is unremarkable. Stomach/Bowel: Large amount of stool within the rectum. Descending and sigmoid colonic diverticulosis. No CT evidence for acute diverticulitis. Normal appendix. Small hiatal hernia. Normal morphology of the stomach. No evidence for small bowel obstruction. No free fluid or free intraperitoneal air. Lymphatic: No retroperitoneal lymphadenopathy. Reproductive: Prior hysterectomy. Other: None. Musculoskeletal: Lumbar spine degenerative changes. Sclerotic lesion within the left aspect of the L1 vertebral body measuring 1.4 cm (image 99; series 5). Review of the MIP images confirms the above findings. IMPRESSION: 1. No evidence for acute thoracic or abdominal aortic dissection. Scattered atherosclerotic changes as described above. 2. No  acute process within the chest, abdomen or pelvis. 3. Probable bone island within the left aspect of the L1 vertebral body. Recommend comparison with outside priors if available. Electronically Signed   By: Lovey Newcomer M.D.   On: 05/13/2017 18:19   Disposition   Pt is being discharged home today in good condition.  Follow-up Plans & Appointments    Follow-up Information    Burnell Blanks, MD Follow up.   Specialty:  Cardiology Why:  Our office will call you for a follow-up appointment. Please call the office if you have not heard from [place] within 3 days. Contact information: [place] [address] [place] [address] [phone]          Discharge Instructions    Diet - low sodium heart healthy    Complete by:  As directed    Discharge instructions   Complete by:  As directed    You were started on Brilinta for your stent, rosuvastatin for extremely high cholesterol, and as-needed nitroglycerin for chest pain.  Your medication list included two different types of metoprolol. You will stop the "Tartrate" and continue the "Succinate" (Toprol) form.  Patients taking blood thinners should generally stay away from medicines like ibuprofen, Advil, Motrin, naproxen, and Aleve due to risk of stomach bleeding. You may take Tylenol as directed or talk to your primary doctor about alternatives.   Increase activity slowly   Complete by:  As directed    No driving for 1 week. No lifting over 10 lbs for 2 weeks. No sexual activity for 2 weeks. Keep procedure site clean & dry. If you notice increased pain, swelling, bleeding or pus, call/return!  You may shower, but no soaking baths/hot tubs/pools for 1 week.      Discharge Medications   Allergies as of 05/15/2017      Reactions   Codeine Nausea And Vomiting   Fosamax [alendronate Sodium] Nausea And Vomiting   Simvastatin    "legs weak"      Medication List    STOP taking these medications   metoprolol tartrate 25 MG tablet Commonly known as:  LOPRESSOR   NAPROSYN PO     TAKE these medications   amLODipine 5 MG tablet Commonly known as:  NORVASC Take 5 mg by mouth daily.   aspirin EC 81 MG tablet Take 81 mg by mouth daily.   calcium carbonate 1250 (500 Ca) MG tablet Commonly known as:  OS-CAL - dosed in mg of elemental calcium Take 1 tablet by mouth daily.   calcium carbonate 500 MG chewable tablet Commonly known as:  TUMS - dosed in mg elemental calcium Chew 2 tablets by mouth 2 (two) times daily as needed for indigestion or heartburn.   cholecalciferol 1000 units tablet Commonly known as:  VITAMIN D Take 1,000 Units by mouth daily.   clotrimazole-betamethasone cream Commonly known as:  LOTRISONE Apply 1 application  topically 2 (two) times daily.   diazepam 10 MG tablet Commonly known as:  VALIUM Take 10 mg by mouth every 6 (six) hours as needed for anxiety.   docusate sodium 100 MG capsule Commonly known as:  COLACE Take 200 mg by mouth at bedtime.   doxycycline 20 MG tablet Commonly known as:  PERIOSTAT Take 20 mg by mouth 2 (two) times daily.   enalapril 20 MG tablet Commonly known as:  VASOTEC Take 20 mg by mouth daily.   furosemide 20 MG tablet Commonly known as:  LASIX Take 20 mg by  mouth daily as needed. fluid   HYDROcodone-acetaminophen 5-325 MG tablet Commonly known as:  NORCO/VICODIN Take 1 tablet by mouth 2 (two) times daily.   loratadine 10 MG tablet Commonly known as:  CLARITIN Take 10 mg by mouth daily.   meclizine 12.5 MG tablet Commonly known as:  ANTIVERT Take 12.5 mg by mouth 3 (three) times daily as needed for dizziness.   metoprolol succinate 25 MG 24 hr tablet Commonly known as:  TOPROL-XL Take 25 mg by mouth daily.   montelukast 10 MG tablet Commonly known as:  SINGULAIR Take 10 mg by mouth at bedtime.   multivitamin with minerals tablet Take 1 tablet by mouth daily.   nitroGLYCERIN 0.4 MG SL tablet Commonly known as:  NITROSTAT Place 1 tablet (0.4 mg total) under the tongue every 5 (five) minutes as needed for chest pain (up to 3 doses).   Omega-3 1000 MG Caps Take 1,000 mg by mouth daily.   polyvinyl alcohol 1.4 % ophthalmic solution Commonly known as:  LIQUIFILM TEARS Place 2 drops into both eyes as needed for dry eyes.   rosuvastatin 40 MG tablet Commonly known as:  CRESTOR Take 1 tablet (40 mg total) by mouth every evening.   ticagrelor 90 MG Tabs tablet Commonly known as:  BRILINTA Take 1 tablet (90 mg total) by mouth 2 (two) times daily.        Allergies:  Allergies  Allergen Reactions  . Codeine Nausea And Vomiting  . Fosamax [Alendronate Sodium] Nausea And Vomiting  . Simvastatin     "legs weak"    Aspirin prescribed at  discharge?  Yes High Intensity Statin Prescribed? (Lipitor 40-80mg  or Crestor 20-40mg ): Yes Beta Blocker Prescribed? Yes For EF <40%, was ACEI/ARB Prescribed? No: n/a - EF normal ADP Receptor Inhibitor Prescribed? (i.e. Plavix etc.-Includes Medically Managed Patients): Yes For EF <40%, Aldosterone Inhibitor Prescribed? No: n/a- EF normal Was EF assessed during THIS hospitalization? Yes Was Cardiac Rehab II ordered? (Included Medically managed Patients): Will be seen by cardiac rehab this morning to determine   Outstanding Labs/Studies   n/a  Duration of Discharge Encounter   Greater than 30 minutes including physician time.  Signed, Nedra Hai Dunn PA-C 05/15/2017, 8:30 AM   The patient was seen and examined. All documentation, labs, and studies were reviewed. She was mildly hypokalemic this morning for which I ordered supplementation. Overall, she is doing well from a symptomatic standpoint. She is on good medical therapy. She will be discharged to home with close follow up.

## 2017-05-17 ENCOUNTER — Telehealth: Payer: Self-pay

## 2017-05-17 MED FILL — Nitroglycerin IV Soln 100 MCG/ML in D5W: INTRA_ARTERIAL | Qty: 10 | Status: AC

## 2017-05-17 MED FILL — Lidocaine HCl Local Inj 1%: INTRAMUSCULAR | Qty: 20 | Status: AC

## 2017-05-17 NOTE — Telephone Encounter (Signed)
Patient contacted regarding discharge from [facility] on 05/15/17.  Patient understands to follow up with provider Dr. Agustin Cree on 05/25/17 at [place] at [facility]. Patient understands discharge instructions? Yes  Patient understands medications and regiment? Yes  Patient understands to bring all medications to this visit? Yes   Patient stated she has been having some leg cramps and asked if she could take some extra potassium. Patient stated she is not taking lasix. Potassium on 05/15/17 was 3.3. Advised patient to eat potassium rich food and to follow up with MD. Patient also requested to see a cardiologist located in the [place] location. Scheduled follow up with Dr. Agustin Cree per patient's request. Patient verbalized understanding and thanked me for the call.

## 2017-05-17 NOTE — Consult Note (Signed)
           [facility] CM Primary Care Navigator  [birth year]  Theresa Prince [birth year] [id number]   Went toseepatient at the bedsideto identify possible discharge needs butshe was alreadydischarged.  PerMD note, patient was seen for chest pain, dyspnea, right arm pain and ruled in for NSTEMI (Non-ST elevated myocardial infarction).  Patient was discharged homeover the weekend.  Primary care provider's officecalled (spoke to [place]) and confirmed that patient is being seen by Delrae Sawyers, PA with [facility] ([place]). Notified of patient's discharge and need for post hospital follow-up and transition of care (TOC).  Patient has discharge instruction to follow-up withcardiology post hospitalization.    For questions, please contact:  Dannielle Huh, BSN, RN- [facility] Primary Care Navigator  Telephone: [phone] [place]

## 2017-05-17 NOTE — Telephone Encounter (Signed)
-----   Message from Theresa Prince, [place] sent at 05/15/2017  8:13 AM EST ----- Regarding: Discharged today: NSTEMI - needs TOC visit and phone call Please schedule this patient for a follow-up appointment and call them with that information.  Primary Cardiologist: New to Dr. Angelena Form this admission (lives in [place] so I am not sure which office is closest long term) Date of Discharge: 05/15/2017 Appointment Needed Within: 7-10 days Appointment Type: TOC visit - will also need phone call  Thank you! Dayna Dunn PA-C

## 2017-05-18 MED FILL — Nitroglycerin SL Tab 0.4 MG: SUBLINGUAL | Qty: 1 | Status: AC

## 2017-05-20 ENCOUNTER — Telehealth: Payer: Self-pay

## 2017-05-20 NOTE — Telephone Encounter (Signed)
Patient called wanting ask a question about her medications. I called the patient back, she was asking about taking an extra Hydrocodone at which I advised to only take it as directed by the prescribe. She then asked about taking Tylenol since she can no longer take Naproxen. I advised that she should contact her PCP have them let her know how much she should take for her break through arthritis pain.

## 2017-05-24 ENCOUNTER — Encounter: Payer: Self-pay | Admitting: *Deleted

## 2017-05-25 ENCOUNTER — Ambulatory Visit (INDEPENDENT_AMBULATORY_CARE_PROVIDER_SITE_OTHER): Payer: PPO | Admitting: Cardiology

## 2017-05-25 ENCOUNTER — Encounter: Payer: Self-pay | Admitting: Cardiology

## 2017-05-25 VITALS — BP 140/76 | HR 76 | Ht 63.0 in | Wt 176.1 lb

## 2017-05-25 DIAGNOSIS — I63512 Cerebral infarction due to unspecified occlusion or stenosis of left middle cerebral artery: Secondary | ICD-10-CM | POA: Diagnosis not present

## 2017-05-25 DIAGNOSIS — I2510 Atherosclerotic heart disease of native coronary artery without angina pectoris: Secondary | ICD-10-CM | POA: Diagnosis not present

## 2017-05-25 DIAGNOSIS — I739 Peripheral vascular disease, unspecified: Secondary | ICD-10-CM

## 2017-05-25 NOTE — Progress Notes (Signed)
Cardiology Office Note:    Date:  05/25/2017   ID:  Theresa Prince, DOB [birth year], MRN [id number]  PCP:  Manfred Shirts, PA  Cardiologist:  Jenne Campus, MD    Referring MD: Manfred Shirts, [place]   Chief Complaint  Patient presents with  . Hospitalization Follow-up  I am doing better  History of Present Illness:    Theresa Prince is a 77 y.o. female with coronary artery disease about a week ago she ended up going to [facility] because of chest pain.  She was find to have non-STEMI with highest troponin of 18-19.  She did have cardiac catheterization which required drug-eluting stent to circumflex artery as well as PTCA to obtuse marginal branch and drug-eluting stent to mid LAD.  Luckily her left ventricular ejection fraction was normal.  She did well with procedures and she is coming back to our office to talk about her problems.  We spent Gradle of time talking about her situation and explained to her procedure as including all things that were done to her.  We also reviewed all her medications and I explained to her the role of the medication.  I stressed the importance of taking dual antiplatelet therapy on the regular basis.  Past Medical History:  Diagnosis Date  . Acute eczematoid otitis externa of both ears 06/30/2016  . Alopecia 01/27/2014  . Anxiety   . Arthritis    "all over" (05/13/2017)  . Bulging lumbar disc 01/27/2014  . CAD in native artery    a. Cardiac cath 05/14/17 showed multivessel disease with 25% Prox-distal RCA, 75% rPDA, 95% OM2 (treated with PTCA), 90% prox-mid Cx (treated with DES), 25% prox LAD< 80% mLAD (treated with DES), 50% mid-distal LAD, 80% distal LAD, LVEDP normal, LVEF 55-65%.  . Cancer ([place])    cervical (pre-cancerous)  . 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 Chronic lower back pain   . Clumsiness 02/27/2016  . Degenerative arthritis 01/27/2014  . Depression   .  Diverticulosis   . Generalized osteoarthritis of multiple sites 07/17/2014  . High cholesterol   . History of stroke without residual deficits 07/17/2014  . Hypertension   . Ingrown nail 03/17/2017  . Lichen sclerosus 46/96/2952  . Mixed emotional features as adjustment reaction 01/27/2014  . Nasal congestion 06/30/2016  . Nasal vestibulitis 06/30/2016  . Neuralgia and neuritis 01/27/2014   Overview:  Overview:  Right anterior thigh Overview:  Right anterior thigh  . NSTEMI (non-ST elevated myocardial infarction) ([place]) 05/13/2017  . Osteopenia 01/27/2014  . PAD (peripheral artery disease) ([place])    a. CT 04/2017 showed peripheral calcified and noncalcified atherosclerotic plaque with atherosclerotic narrowing of the splenic artery and atherosclerotic narrowing  . Pain in toes of both feet 03/17/2017  . Perennial allergic rhinitis with seasonal variation 07/17/2014  . Perioral dermatitis 12/02/2016  . PFO (patent foramen ovale)    a. noted on echo 04/2017.  Marland Kitchen Pneumonia    "twice" (05/13/2017)  . Postlaminectomy syndrome, lumbar 01/27/2014  . Primary osteoarthritis of right knee 01/27/2014  . Slow transit constipation 07/17/2014  . Stroke [facility]) 2014   "speech issues; mostly resolved; still have trouble pronouncing some words" (05/13/2017)  . Trochanteric bursitis of both hips 07/17/2014  . Varicose vein of leg   . Varicose veins 01/27/2014  . 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 Vitamin D deficiency 01/27/2014    Past Surgical History:  Procedure Laterality  Date  . BACK SURGERY    . CORONARY BALLOON ANGIOPLASTY N/A 05/14/2017   Procedure: CORONARY BALLOON ANGIOPLASTY;  Surgeon: Jettie Booze, MD;  Location: [place] [facility];  Service: Cardiovascular;  Laterality: N/A;  OM2  . CORONARY STENT INTERVENTION N/A 05/14/2017   Procedure: CORONARY STENT INTERVENTION;  Surgeon: Jettie Booze, MD;  Location: [place] [facility];  Service: Cardiovascular;  Laterality: N/A;    . CYST EXCISION  1999   "took off my spine"  . LEFT HEART CATH AND CORONARY ANGIOGRAPHY N/A 05/14/2017   Procedure: LEFT HEART CATH AND CORONARY ANGIOGRAPHY;  Surgeon: Jettie Booze, MD;  Location: [place] [facility];  Service: Cardiovascular;  Laterality: N/A;  . VAGINAL HYSTERECTOMY  1982    Current Medications: Current Meds  Medication Sig  . acetaminophen (TYLENOL) 650 MG CR tablet Take 650 mg by mouth every 8 (eight) hours as needed for pain.  Marland Kitchen amLODipine (NORVASC) 5 MG tablet Take 5 mg by mouth daily.  Marland Kitchen aspirin EC 81 MG tablet Take 81 mg by mouth daily.  . calcium carbonate (TUMS - DOSED IN MG ELEMENTAL CALCIUM) 500 MG chewable tablet Chew 2 tablets by mouth 2 (two) times daily as needed for indigestion or heartburn.  . Calcium Citrate-Vitamin D (CALCIUM CITRATE+D3 PETITES PO) Take by mouth 2 (two) times daily.  . Cholecalciferol (VITAMIN D3) 1000 units CAPS Take by mouth daily.  . clotrimazole-betamethasone (LOTRISONE) cream Apply 1 application topically 2 (two) times daily.  . diazepam (VALIUM) 5 MG tablet Take 5 mg by mouth daily as needed for anxiety.  . docusate sodium (COLACE) 100 MG capsule Take 200 mg by mouth at bedtime.  Marland Kitchen doxycycline (PERIOSTAT) 20 MG tablet Take 20 mg by mouth 2 (two) times daily.  . enalapril (VASOTEC) 20 MG tablet Take 20 mg by mouth daily.  . fluticasone (FLONASE) 50 MCG/ACT nasal spray Place 1 spray into both nostrils daily.  Marland Kitchen HYDROcodone-acetaminophen (NORCO/VICODIN) 5-325 MG tablet Take 1 tablet by mouth 2 (two) times daily.   . meclizine (ANTIVERT) 12.5 MG tablet Take 12.5 mg by mouth 3 (three) times daily as needed for dizziness.  . metoprolol succinate (TOPROL-XL) 25 MG 24 hr tablet Take 25 mg by mouth daily.  . metroNIDAZOLE (METROGEL) 0.75 % gel Apply 1 application topically 2 (two) times daily.  . montelukast (SINGULAIR) 10 MG tablet Take 10 mg by mouth at bedtime.  . Multiple Vitamins-Minerals (MULTIVITAMIN WITH MINERALS) tablet  Take 1 tablet by mouth daily.   Marland Kitchen neomycin-polymyxin-gramicidin (NEOSPORIN) 1.75-10000-.025 ophthalmic solution Place 1 drop into both eyes 4 (four) times daily.  . nitroGLYCERIN (NITROSTAT) 0.4 MG SL tablet Place 1 tablet (0.4 mg total) under the tongue every 5 (five) minutes as needed for chest pain (up to 3 doses).  . Omega-3 Krill Oil 500 MG CAPS Take by mouth daily.  . polyvinyl alcohol (LIQUIFILM TEARS) 1.4 % ophthalmic solution Place 2 drops into both eyes as needed for dry eyes.  . rosuvastatin (CRESTOR) 40 MG tablet Take 1 tablet (40 mg total) by mouth every evening.  . ticagrelor (BRILINTA) 90 MG TABS tablet Take 1 tablet (90 mg total) by mouth 2 (two) times daily.  Marland Kitchen tiZANidine (ZANAFLEX) 4 MG tablet Take 4 mg by mouth every 8 (eight) hours as needed for muscle spasms.  . Turmeric 500 MG TABS Take by mouth 2 (two) times daily.     Allergies:   Codeine; Fosamax [alendronate sodium]; and Simvastatin   Social History  Socioeconomic History  . Marital status: Widowed    Spouse name: None  . Number of children: None  . Years of education: None  . Highest education level: None  Social Needs  . Financial resource strain: None  . Food insecurity - worry: None  . Food insecurity - inability: None  . Transportation needs - medical: None  . Transportation needs - non-medical: None  Occupational History  . None  Tobacco Use  . Smoking status: Never Smoker  . Smokeless tobacco: Never Used  Substance and Sexual Activity  . Alcohol use: No    Frequency: Never  . Drug use: No  . Sexual activity: No    Birth control/protection: Surgical  Other Topics Concern  . None  Social History Narrative  . None     Family History: The patient's family history is not on file. ROS:   Please see the history of present illness.    All 14 point review of systems negative except as described per history of present illness  EKGs/Labs/Other Studies Reviewed:      Recent Labs: 05/14/2017:  TSH 1.711 05/15/2017: ALT 20; BUN 11; Creatinine, Ser 0.79; Hemoglobin 12.3; Platelets 234; Potassium 3.3; Sodium 137  Recent Lipid Panel    Component Value Date/Time   CHOL 250 (H) 05/14/2017 0247   TRIG 89 05/14/2017 0247   HDL 51 05/14/2017 0247   CHOLHDL 4.9 05/14/2017 0247   VLDL 18 05/14/2017 0247   LDLCALC 181 (H) 05/14/2017 0247    Physical Exam:    VS:  BP 140/76 (BP Location: Left Arm, Patient Position: Sitting, Cuff Size: Normal)   Pulse 76   Ht 5\' 3"  (1.6 m)   Wt 176 lb 1.6 oz (79.9 kg)   SpO2 98%   BMI 31.19 kg/m     Wt Readings from Last 3 Encounters:  05/25/17 176 lb 1.6 oz (79.9 kg)  05/15/17 179 lb 4.8 oz (81.3 kg)     GEN:  Well nourished, well developed in no acute distress HEENT: Normal NECK: No JVD; No carotid bruits LYMPHATICS: No lymphadenopathy CARDIAC: RRR, no murmurs, no rubs, no gallops RESPIRATORY:  Clear to auscultation without rales, wheezing or rhonchi  ABDOMEN: Soft, non-tender, non-distended MUSCULOSKELETAL:  No edema; No deformity  SKIN: Warm and dry LOWER EXTREMITIES: no swelling NEUROLOGIC:  Alert and oriented x 3 PSYCHIATRIC:  Normal affect   ASSESSMENT:    1. Cerebrovascular accident (CVA) due to occlusion of left middle cerebral artery ([place])   2. PAD (peripheral artery disease) ([place])   3. CAD in native artery    PLAN:    In order of problems listed above:  1. CVA: Doing well from that point of view she does have cardiac arterial disease that was checked last time more than a year ago we will do the test. 2. Coronary artery disease: As discussed above.  We will continue present management I see her back in my office in 1 month 3. Dyslipidemia: She will have fasting lipid profile checked in the next month.  She is on high intensity statin which I will continue.   Medication Adjustments/Labs and Tests Ordered: Current medicines are reviewed at length with the patient today.  Concerns regarding medicines are outlined above.   No orders of the defined types were placed in this encounter.  Medication changes: No orders of the defined types were placed in this encounter.   Signed, Park Liter, MD, [facility] 05/25/2017 5:18 PM    [facility]

## 2017-05-25 NOTE — Patient Instructions (Signed)
Medication Instructions:  Your physician recommends that you continue on your current medications as directed. Please refer to the Current Medication list given to you today.  Labwork: None  Testing/Procedures: Your physician has requested that you have a carotid duplex. This test is an ultrasound of the carotid arteries in your neck. It looks at blood flow through these arteries that supply the brain with blood. Allow one hour for this exam. There are no restrictions or special instructions.  Follow-Up: Your physician recommends that you schedule a follow-up appointment in: 1 month  Any Other Special Instructions Will Be Listed Below (If Applicable).     If you need a refill on your cardiac medications before your next appointment, please call your pharmacy.   [place], RN, BSN

## 2017-05-31 DIAGNOSIS — I10 Essential (primary) hypertension: Secondary | ICD-10-CM | POA: Diagnosis not present

## 2017-05-31 DIAGNOSIS — M5126 Other intervertebral disc displacement, lumbar region: Secondary | ICD-10-CM | POA: Diagnosis not present

## 2017-05-31 DIAGNOSIS — I63512 Cerebral infarction due to unspecified occlusion or stenosis of left middle cerebral artery: Secondary | ICD-10-CM | POA: Diagnosis not present

## 2017-05-31 DIAGNOSIS — M159 Polyosteoarthritis, unspecified: Secondary | ICD-10-CM | POA: Diagnosis not present

## 2017-06-17 ENCOUNTER — Ambulatory Visit (HOSPITAL_BASED_OUTPATIENT_CLINIC_OR_DEPARTMENT_OTHER)
Admission: RE | Admit: 2017-06-17 | Discharge: 2017-06-17 | Disposition: A | Discharge status: Home / Self Care | Payer: PPO | Source: Ambulatory Visit | Attending: Cardiology | Admitting: Cardiology

## 2017-06-17 DIAGNOSIS — I739 Peripheral vascular disease, unspecified: Secondary | ICD-10-CM | POA: Diagnosis not present

## 2017-06-17 DIAGNOSIS — I63512 Cerebral infarction due to unspecified occlusion or stenosis of left middle cerebral artery: Secondary | ICD-10-CM

## 2017-06-17 DIAGNOSIS — I6523 Occlusion and stenosis of bilateral carotid arteries: Secondary | ICD-10-CM | POA: Diagnosis not present

## 2017-06-17 NOTE — Progress Notes (Signed)
  Carotid doppler performed, Mild atherosclerosis seen Jimmy Margaruite Top RDCS Stacie Knutzen, Alyson Locket 06/17/2017, 3:07 PM

## 2017-06-22 ENCOUNTER — Ambulatory Visit (INDEPENDENT_AMBULATORY_CARE_PROVIDER_SITE_OTHER): Payer: PPO | Admitting: Cardiology

## 2017-06-22 ENCOUNTER — Encounter: Payer: Self-pay | Admitting: Cardiology

## 2017-06-22 VITALS — BP 118/70 | HR 60 | Resp 10 | Ht 63.0 in | Wt 176.4 lb

## 2017-06-22 DIAGNOSIS — I63512 Cerebral infarction due to unspecified occlusion or stenosis of left middle cerebral artery: Secondary | ICD-10-CM | POA: Diagnosis not present

## 2017-06-22 DIAGNOSIS — I739 Peripheral vascular disease, unspecified: Secondary | ICD-10-CM

## 2017-06-22 DIAGNOSIS — I2510 Atherosclerotic heart disease of native coronary artery without angina pectoris: Secondary | ICD-10-CM | POA: Diagnosis not present

## 2017-06-22 DIAGNOSIS — E7800 Pure hypercholesterolemia, unspecified: Secondary | ICD-10-CM

## 2017-06-22 DIAGNOSIS — I10 Essential (primary) hypertension: Secondary | ICD-10-CM | POA: Diagnosis not present

## 2017-06-22 NOTE — Patient Instructions (Signed)
Medication Instructions:  Your physician recommends that you continue on your current medications as directed. Please refer to the Current Medication list given to you today.   Labwork: Your physician recommends that you return for lab work today: lipid panel, CMP.  Testing/Procedures: None  Follow-Up: Your physician wants you to follow-up in: 3 months. You will receive a reminder letter in the mail two months in advance. If you don't receive a letter, please call our office to schedule the follow-up appointment.   Any Other Special Instructions Will Be Listed Below (If Applicable).     If you need a refill on your cardiac medications before your next appointment, please call your pharmacy.

## 2017-06-22 NOTE — Progress Notes (Signed)
Cardiology Office Note:    Date:  06/22/2017   ID:  Theresa Prince, DOB [birth year], MRN [id number]  PCP:  Manfred Shirts, PA  Cardiologist:  Jenne Campus, MD    Referring MD: Manfred Shirts, [place]   Chief Complaint  Patient presents with  . 1 month follow up  Doing well  History of Present Illness:    Theresa Prince is a 77 y.o. female coronary artery disease status post PTCA and drug-eluting stenting done about 2 months ago.  Doing well from that point review no chest pain tightness squeezing pressure burning chest.  She started exercising on the regular basis.  She started walking 5 minutes every day now she goes up to 30 minutes every day have no difficulty doing it.  I congratulated her for it and encouraged her to continue.  Is taking high intensity statin I will check her fasting lipid profile today.  Past Medical History:  Diagnosis Date  . Acute eczematoid otitis externa of both ears 06/30/2016  . Alopecia 01/27/2014  . Anxiety   . Arthritis    "all over" (05/13/2017)  . Bulging lumbar disc 01/27/2014  . CAD in native artery    a. Cardiac cath 05/14/17 showed multivessel disease with 25% Prox-distal RCA, 75% rPDA, 95% OM2 (treated with PTCA), 90% prox-mid Cx (treated with DES), 25% prox LAD< 80% mLAD (treated with DES), 50% mid-distal LAD, 80% distal LAD, LVEDP normal, LVEF 55-65%.  . Cancer ([place])    cervical (pre-cancerous)  . Cerebrovascular accident (CVA) due to occlusion of left middle cerebral artery ([place]) 02/27/2016  . Chronic lower back pain   . Clumsiness 02/27/2016  . Degenerative arthritis 01/27/2014  . Depression   . Diverticulosis   . Generalized osteoarthritis of multiple sites 07/17/2014  . High cholesterol   . History of stroke without residual deficits 07/17/2014  . Hypertension   . Ingrown nail 03/17/2017  . Lichen sclerosus 24/40/1027  . Mixed emotional features as adjustment reaction 01/27/2014  . Nasal congestion 06/30/2016  . Nasal vestibulitis  06/30/2016  . Neuralgia and neuritis 01/27/2014   Overview:  Overview:  Right anterior thigh Overview:  Right anterior thigh  . NSTEMI (non-ST elevated myocardial infarction) ([place]) 05/13/2017  . Osteopenia 01/27/2014  . PAD (peripheral artery disease) ([place])    a. CT 04/2017 showed peripheral calcified and noncalcified atherosclerotic plaque with atherosclerotic narrowing of the splenic artery and atherosclerotic narrowing  . Pain in toes of both feet 03/17/2017  . Perennial allergic rhinitis with seasonal variation 07/17/2014  . Perioral dermatitis 12/02/2016  . PFO (patent foramen ovale)    a. noted on echo 04/2017.  Marland Kitchen Pneumonia    "twice" (05/13/2017)  . Postlaminectomy syndrome, lumbar 01/27/2014  . Primary osteoarthritis of right knee 01/27/2014  . Slow transit constipation 07/17/2014  . Stroke [facility]) 2014   "speech issues; mostly resolved; still have trouble pronouncing some words" (05/13/2017)  . Trochanteric bursitis of both hips 07/17/2014  . Varicose vein of leg   . Varicose veins 01/27/2014  . 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 Vitamin D deficiency 01/27/2014    Past Surgical History:  Procedure Laterality Date  . BACK SURGERY    . CORONARY BALLOON ANGIOPLASTY N/A 05/14/2017   Procedure: CORONARY BALLOON ANGIOPLASTY;  Surgeon: Jettie Booze, MD;  Location: [place] [facility];  Service: Cardiovascular;  Laterality: N/A;  OM2  . CORONARY STENT INTERVENTION N/A 05/14/2017   Procedure: CORONARY STENT INTERVENTION;  Surgeon: Jettie Booze,  MD;  Location: [place] [facility];  Service: Cardiovascular;  Laterality: N/A;  . CYST EXCISION  1999   "took off my spine"  . LEFT HEART CATH AND CORONARY ANGIOGRAPHY N/A 05/14/2017   Procedure: LEFT HEART CATH AND CORONARY ANGIOGRAPHY;  Surgeon: Jettie Booze, MD;  Location: [place] [facility];  Service: Cardiovascular;  Laterality: N/A;  . VAGINAL HYSTERECTOMY  1982    Current Medications: Current Meds    Medication Sig  . acetaminophen (TYLENOL) 650 MG CR tablet Take 650 mg by mouth every 8 (eight) hours as needed for pain.  Marland Kitchen amLODipine (NORVASC) 5 MG tablet Take 5 mg by mouth daily.  Marland Kitchen aspirin EC 81 MG tablet Take 81 mg by mouth daily.  . calcium carbonate (OS-CAL - DOSED IN MG OF ELEMENTAL CALCIUM) 1250 (500 Ca) MG tablet Take 1 tablet by mouth daily.  . calcium carbonate (TUMS - DOSED IN MG ELEMENTAL CALCIUM) 500 MG chewable tablet Chew 2 tablets by mouth 2 (two) times daily as needed for indigestion or heartburn.  . Calcium Citrate-Vitamin D (CALCIUM CITRATE+D3 PETITES PO) Take by mouth 2 (two) times daily.  . cholecalciferol (VITAMIN D) 1000 units tablet Take 1,000 Units by mouth daily.  . clotrimazole-betamethasone (LOTRISONE) cream Apply 1 application topically 2 (two) times daily.  . diazepam (VALIUM) 10 MG tablet Take 10 mg by mouth every 6 (six) hours as needed for anxiety.  . DOCOSAHEXAENOIC ACID PO Take by mouth.  . docusate sodium (COLACE) 100 MG capsule Take 200 mg by mouth at bedtime.  Marland Kitchen doxycycline (PERIOSTAT) 20 MG tablet Take 20 mg by mouth 2 (two) times daily.  . enalapril (VASOTEC) 20 MG tablet Take 20 mg by mouth daily.  . fluticasone (FLONASE) 50 MCG/ACT nasal spray Place 1 spray into both nostrils daily.  . furosemide (LASIX) 20 MG tablet Take 20 mg by mouth daily as needed. fluid  . HYDROcodone-acetaminophen (NORCO/VICODIN) 5-325 MG tablet Take 1 tablet by mouth 2 (two) times daily.   Marland Kitchen loratadine (CLARITIN) 10 MG tablet Take 10 mg by mouth daily. Only takes in Summer  . meclizine (ANTIVERT) 12.5 MG tablet Take 12.5 mg by mouth 3 (three) times daily as needed for dizziness.  . metoprolol succinate (TOPROL-XL) 25 MG 24 hr tablet Take 25 mg by mouth daily.  . metroNIDAZOLE (METROGEL) 0.75 % gel Apply 1 application topically 2 (two) times daily.  . montelukast (SINGULAIR) 10 MG tablet Take 10 mg by mouth at bedtime.  . Multiple Vitamins-Minerals (MULTIVITAMIN WITH  MINERALS) tablet Take 1 tablet by mouth daily.   Marland Kitchen neomycin-polymyxin-gramicidin (NEOSPORIN) 1.75-10000-.025 ophthalmic solution Place 1 drop into both eyes 4 (four) times daily.  . nitroGLYCERIN (NITROSTAT) 0.4 MG SL tablet Place 1 tablet (0.4 mg total) under the tongue every 5 (five) minutes as needed for chest pain (up to 3 doses).  . Omega-3 Krill Oil 500 MG CAPS Take by mouth daily.  . polyvinyl alcohol (LIQUIFILM TEARS) 1.4 % ophthalmic solution Place 2 drops into both eyes as needed for dry eyes.  . potassium chloride (K-DUR) 10 MEQ tablet Take 10 mEq by mouth daily as needed. Take 1 capsule by mouth daily with the use of lasix as directed  . rosuvastatin (CRESTOR) 40 MG tablet Take 1 tablet (40 mg total) by mouth every evening.  . ticagrelor (BRILINTA) 90 MG TABS tablet Take 1 tablet (90 mg total) by mouth 2 (two) times daily.  Marland Kitchen tiZANidine (ZANAFLEX) 4 MG tablet Take 4 mg by mouth every  8 (eight) hours as needed for muscle spasms.  . Turmeric 500 MG TABS Take by mouth 2 (two) times daily.     Allergies:   Codeine; Fosamax [alendronate sodium]; and Simvastatin   Social History   Socioeconomic History  . Marital status: Widowed    Spouse name: None  . Number of children: None  . Years of education: None  . Highest education level: None  Social Needs  . Financial resource strain: None  . Food insecurity - worry: None  . Food insecurity - inability: None  . Transportation needs - medical: None  . Transportation needs - non-medical: None  Occupational History  . None  Tobacco Use  . Smoking status: Never Smoker  . Smokeless tobacco: Never Used  Substance and Sexual Activity  . Alcohol use: No    Frequency: Never  . Drug use: No  . Sexual activity: No    Birth control/protection: Surgical  Other Topics Concern  . None  Social History Narrative  . None     Family History: The patient's family history is not on file. ROS:   Please see the history of present illness.     All 14 point review of systems negative except as described per history of present illness  EKGs/Labs/Other Studies Reviewed:      Recent Labs: 05/14/2017: TSH 1.711 05/15/2017: ALT 20; BUN 11; Creatinine, Ser 0.79; Hemoglobin 12.3; Platelets 234; Potassium 3.3; Sodium 137  Recent Lipid Panel    Component Value Date/Time   CHOL 250 (H) 05/14/2017 0247   TRIG 89 05/14/2017 0247   HDL 51 05/14/2017 0247   CHOLHDL 4.9 05/14/2017 0247   VLDL 18 05/14/2017 0247   LDLCALC 181 (H) 05/14/2017 0247    Physical Exam:    VS:  BP 118/70   Pulse 60   Resp 10   Ht 5\' 3"  (1.6 m)   Wt 176 lb 6.4 oz (80 kg)   BMI 31.25 kg/m     Wt Readings from Last 3 Encounters:  06/22/17 176 lb 6.4 oz (80 kg)  05/25/17 176 lb 1.6 oz (79.9 kg)  05/15/17 179 lb 4.8 oz (81.3 kg)     GEN:  Well nourished, well developed in no acute distress HEENT: Normal NECK: No JVD; No carotid bruits LYMPHATICS: No lymphadenopathy CARDIAC: RRR, no murmurs, no rubs, no gallops RESPIRATORY:  Clear to auscultation without rales, wheezing or rhonchi  ABDOMEN: Soft, non-tender, non-distended MUSCULOSKELETAL:  No edema; No deformity  SKIN: Warm and dry LOWER EXTREMITIES: no swelling NEUROLOGIC:  Alert and oriented x 3 PSYCHIATRIC:  Normal affect   ASSESSMENT:    1. CAD in native artery   2. Essential hypertension   3. Cerebrovascular accident (CVA) due to occlusion of left middle cerebral artery ([place])   4. PAD (peripheral artery disease) ([place])   5. High cholesterol    PLAN:    In order of problems listed above:  1. Coronary artery disease: Stable on appropriate medications doing well. 2. His lipidemia: We will check a fasting lipid profile today she is on 40 mg of Crestor which I will continue 3. Peripheral vascular disease: Only up to 39% stenosis of bilateral carotid arteries.  Continue present management.  All she is doing very well I stressed again the importance of taking all medications on the regular  basis which she does.  I encouraged her to exercise more I advised her to go twice a day for 30 minutes of walking she can do it.  Medication Adjustments/Labs and Tests Ordered: Current medicines are reviewed at length with the patient today.  Concerns regarding medicines are outlined above.  No orders of the defined types were placed in this encounter.  Medication changes: No orders of the defined types were placed in this encounter.   Signed, Park Liter, MD, [facility] 06/22/2017 3:09 PM    [place]

## 2017-06-22 NOTE — Addendum Note (Signed)
Addended by: Austin Miles on: 06/22/2017 03:14 PM   Modules accepted: Orders

## 2017-06-23 ENCOUNTER — Encounter: Payer: Self-pay | Admitting: *Deleted

## 2017-06-23 LAB — COMPREHENSIVE METABOLIC PANEL
ALBUMIN: 4.4 g/dL (ref 3.5–4.8)
ALK PHOS: 84 IU/L (ref 39–117)
ALT: 19 IU/L (ref 0–32)
AST: 27 IU/L (ref 0–40)
Albumin/Globulin Ratio: 1.6 (ref 1.2–2.2)
BUN / CREAT RATIO: 14 (ref 12–28)
BUN: 11 mg/dL (ref 8–27)
Bilirubin Total: 0.4 mg/dL (ref 0.0–1.2)
CHLORIDE: 103 mmol/L (ref 96–106)
CO2: 24 mmol/L (ref 20–29)
CREATININE: 0.76 mg/dL (ref 0.57–1.00)
Calcium: 10.2 mg/dL (ref 8.7–10.3)
GFR calc Af Amer: 88 mL/min/{1.73_m2} (ref >59)
GFR calc non Af Amer: 76 mL/min/{1.73_m2} (ref >59)
GLUCOSE: 95 mg/dL (ref 65–99)
Globulin, Total: 2.7 g/dL (ref 1.5–4.5)
Potassium: 4.7 mmol/L (ref 3.5–5.2)
Sodium: 142 mmol/L (ref 134–144)
Total Protein: 7.1 g/dL (ref 6.0–8.5)

## 2017-06-23 LAB — LIPID PANEL
Chol/HDL Ratio: 2.8 ratio (ref 0.0–4.4)
Cholesterol, Total: 134 mg/dL (ref 100–199)
HDL: 48 mg/dL (ref >39)
LDL CALC: 71 mg/dL (ref 0–99)
TRIGLYCERIDES: 76 mg/dL (ref 0–149)
VLDL CHOLESTEROL CAL: 15 mg/dL (ref 5–40)

## 2017-06-30 MED FILL — Perflutren Lipid Microsphere IV Susp 1.1 MG/ML: INTRAVENOUS | Qty: 10 | Status: AC

## 2017-07-02 ENCOUNTER — Other Ambulatory Visit: Payer: Self-pay | Admitting: Cardiology

## 2017-08-23 DIAGNOSIS — M5126 Other intervertebral disc displacement, lumbar region: Secondary | ICD-10-CM | POA: Diagnosis not present

## 2017-08-23 DIAGNOSIS — E559 Vitamin D deficiency, unspecified: Secondary | ICD-10-CM | POA: Diagnosis not present

## 2017-08-23 DIAGNOSIS — I10 Essential (primary) hypertension: Secondary | ICD-10-CM | POA: Diagnosis not present

## 2017-08-23 DIAGNOSIS — E782 Mixed hyperlipidemia: Secondary | ICD-10-CM | POA: Diagnosis not present

## 2017-08-23 DIAGNOSIS — R5383 Other fatigue: Secondary | ICD-10-CM | POA: Diagnosis not present

## 2017-08-23 DIAGNOSIS — R7301 Impaired fasting glucose: Secondary | ICD-10-CM | POA: Diagnosis not present

## 2017-08-23 DIAGNOSIS — M159 Polyosteoarthritis, unspecified: Secondary | ICD-10-CM | POA: Diagnosis not present

## 2017-08-25 DIAGNOSIS — H6123 Impacted cerumen, bilateral: Secondary | ICD-10-CM | POA: Insufficient documentation

## 2017-08-25 DIAGNOSIS — H93293 Other abnormal auditory perceptions, bilateral: Secondary | ICD-10-CM | POA: Insufficient documentation

## 2017-08-25 HISTORY — DX: Other abnormal auditory perceptions, bilateral: H93.293

## 2017-08-25 HISTORY — DX: Impacted cerumen, bilateral: H61.23

## 2017-08-26 ENCOUNTER — Telehealth: Payer: Self-pay | Admitting: *Deleted

## 2017-08-26 MED ORDER — TICAGRELOR 90 MG PO TABS: 90.0000 mg | ORAL_TABLET | Freq: Two times a day (BID) | ORAL | 2 refills | Status: DC

## 2017-08-26 MED ORDER — ROSUVASTATIN CALCIUM 40 MG PO TABS: 40.0000 mg | ORAL_TABLET | Freq: Every evening | ORAL | 2 refills | Status: DC

## 2017-08-26 NOTE — Telephone Encounter (Signed)
Refills sent to [facility].

## 2017-08-26 NOTE — Telephone Encounter (Signed)
Pt requested to change her prescriptions to 90 day supply: Brilinta and Rosuvastatin. Please send to [facility].

## 2017-10-19 ENCOUNTER — Ambulatory Visit: Payer: PPO | Admitting: Cardiology

## 2017-10-19 DIAGNOSIS — H25013 Cortical age-related cataract, bilateral: Secondary | ICD-10-CM | POA: Diagnosis not present

## 2017-10-19 DIAGNOSIS — H2513 Age-related nuclear cataract, bilateral: Secondary | ICD-10-CM | POA: Diagnosis not present

## 2017-10-19 DIAGNOSIS — H35371 Puckering of macula, right eye: Secondary | ICD-10-CM | POA: Diagnosis not present

## 2017-10-19 DIAGNOSIS — H353131 Nonexudative age-related macular degeneration, bilateral, early dry stage: Secondary | ICD-10-CM | POA: Diagnosis not present

## 2017-10-21 ENCOUNTER — Encounter: Payer: Self-pay | Admitting: Cardiology

## 2017-10-21 ENCOUNTER — Ambulatory Visit (INDEPENDENT_AMBULATORY_CARE_PROVIDER_SITE_OTHER): Payer: PPO | Admitting: Cardiology

## 2017-10-21 VITALS — BP 118/70 | HR 74 | Ht 63.0 in | Wt 179.6 lb

## 2017-10-21 DIAGNOSIS — I739 Peripheral vascular disease, unspecified: Secondary | ICD-10-CM | POA: Diagnosis not present

## 2017-10-21 DIAGNOSIS — I2510 Atherosclerotic heart disease of native coronary artery without angina pectoris: Secondary | ICD-10-CM | POA: Diagnosis not present

## 2017-10-21 DIAGNOSIS — I10 Essential (primary) hypertension: Secondary | ICD-10-CM

## 2017-10-21 NOTE — Progress Notes (Signed)
Cardiology Office Note:    Date:  10/21/2017   ID:  Theresa Prince, DOB [birth year], MRN [id number]  PCP:  Manfred Shirts, PA  Cardiologist:  Jenne Campus, MD    Referring MD: Manfred Shirts, [place]   No chief complaint on file. Doing well  History of Present Illness:    Theresa Prince is a 77 y.o. female with coronary artery disease status PTCA and drug-eluting stent done months ago she is doing well asymptomatic no chest pain tightness squeezing pressure branches her stressor is good she exercised on the regular basis walking for half an hour twice daily I congratulated her for it and encouraged her to keep doing this.  Past Medical History:  Diagnosis Date  . Acute eczematoid otitis externa of both ears 06/30/2016  . Alopecia 01/27/2014  . Anxiety   . Arthritis    "all over" (05/13/2017)  . Bulging lumbar disc 01/27/2014  . CAD in native artery    a. Cardiac cath 05/14/17 showed multivessel disease with 25% Prox-distal RCA, 75% rPDA, 95% OM2 (treated with PTCA), 90% prox-mid Cx (treated with DES), 25% prox LAD< 80% mLAD (treated with DES), 50% mid-distal LAD, 80% distal LAD, LVEDP normal, LVEF 55-65%.  . Cancer ([place])    cervical (pre-cancerous)  . 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 Chronic lower back pain   . Clumsiness 02/27/2016  . Degenerative arthritis 01/27/2014  . Depression   . Diverticulosis   . Generalized osteoarthritis of multiple sites 07/17/2014  . High cholesterol   . History of stroke without residual deficits 07/17/2014  . Hypertension   . Ingrown nail 03/17/2017  . Lichen sclerosus 21/30/8657  . Mixed emotional features as adjustment reaction 01/27/2014  . Nasal congestion 06/30/2016  . Nasal vestibulitis 06/30/2016  . Neuralgia and neuritis 01/27/2014   Overview:  Overview:  Right anterior thigh Overview:  Right anterior thigh  . NSTEMI (non-ST elevated myocardial infarction) ([place]) 05/13/2017  .  Osteopenia 01/27/2014  . PAD (peripheral artery disease) ([place])    a. CT 04/2017 showed peripheral calcified and noncalcified atherosclerotic plaque with atherosclerotic narrowing of the splenic artery and atherosclerotic narrowing  . Pain in toes of both feet 03/17/2017  . Perennial allergic rhinitis with seasonal variation 07/17/2014  . Perioral dermatitis 12/02/2016  . PFO (patent foramen ovale)    a. noted on echo 04/2017.  Marland Kitchen Pneumonia    "twice" (05/13/2017)  . Postlaminectomy syndrome, lumbar 01/27/2014  . Primary osteoarthritis of right knee 01/27/2014  . Slow transit constipation 07/17/2014  . Stroke [facility]) 2014   "speech issues; mostly resolved; still have trouble pronouncing some words" (05/13/2017)  . Trochanteric bursitis of both hips 07/17/2014  . Varicose vein of leg   . Varicose veins 01/27/2014  . 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 Vitamin D deficiency 01/27/2014    Past Surgical History:  Procedure Laterality Date  . BACK SURGERY    . CORONARY BALLOON ANGIOPLASTY N/A 05/14/2017   Procedure: CORONARY BALLOON ANGIOPLASTY;  Surgeon: Jettie Booze, MD;  Location: [place] [facility];  Service: Cardiovascular;  Laterality: N/A;  OM2  . CORONARY STENT INTERVENTION N/A 05/14/2017   Procedure: CORONARY STENT INTERVENTION;  Surgeon: Jettie Booze, MD;  Location: [place] [facility];  Service: Cardiovascular;  Laterality: N/A;  . CYST EXCISION  1999   "took off my spine"  . LEFT HEART CATH AND CORONARY ANGIOGRAPHY N/A 05/14/2017   Procedure: LEFT  HEART CATH AND CORONARY ANGIOGRAPHY;  Surgeon: Jettie Booze, MD;  Location: [place] [facility];  Service: Cardiovascular;  Laterality: N/A;  . VAGINAL HYSTERECTOMY  1982    Current Medications: Current Meds  Medication Sig  . acetaminophen (TYLENOL) 650 MG CR tablet Take 650 mg by mouth every 8 (eight) hours as needed for pain.  Marland Kitchen amLODipine (NORVASC) 5 MG tablet Take 5 mg by mouth daily.  Marland Kitchen aspirin  EC 81 MG tablet Take 81 mg by mouth daily.  . calcium carbonate (OS-CAL - DOSED IN MG OF ELEMENTAL CALCIUM) 1250 (500 Ca) MG tablet Take 1 tablet by mouth daily.  . calcium carbonate (TUMS - DOSED IN MG ELEMENTAL CALCIUM) 500 MG chewable tablet Chew 2 tablets by mouth 2 (two) times daily as needed for indigestion or heartburn.  . Calcium Citrate-Vitamin D (CALCIUM CITRATE+D3 PETITES PO) Take by mouth 2 (two) times daily.  . cholecalciferol (VITAMIN D) 1000 units tablet Take 1,000 Units by mouth daily.  . clotrimazole-betamethasone (LOTRISONE) cream Apply 1 application topically 2 (two) times daily.  . diazepam (VALIUM) 10 MG tablet Take 10 mg by mouth every 6 (six) hours as needed for anxiety.  . DOCOSAHEXAENOIC ACID PO Take by mouth.  . docusate sodium (COLACE) 100 MG capsule Take 200 mg by mouth at bedtime.  Marland Kitchen doxycycline (PERIOSTAT) 20 MG tablet Take 20 mg by mouth 2 (two) times daily.  . enalapril (VASOTEC) 20 MG tablet Take 20 mg by mouth daily.  . fluticasone (FLONASE) 50 MCG/ACT nasal spray Place 1 spray into both nostrils daily.  . furosemide (LASIX) 20 MG tablet Take 20 mg by mouth daily as needed. fluid  . HYDROcodone-acetaminophen (NORCO/VICODIN) 5-325 MG tablet Take 1 tablet by mouth 2 (two) times daily.   Marland Kitchen loratadine (CLARITIN) 10 MG tablet Take 10 mg by mouth daily. Only takes in Summer  . meclizine (ANTIVERT) 12.5 MG tablet Take 12.5 mg by mouth 3 (three) times daily as needed for dizziness.  . metoprolol succinate (TOPROL-XL) 25 MG 24 hr tablet Take 25 mg by mouth daily.  . metroNIDAZOLE (METROGEL) 0.75 % gel Apply 1 application topically 2 (two) times daily.  . montelukast (SINGULAIR) 10 MG tablet Take 10 mg by mouth at bedtime.  . Multiple Vitamins-Minerals (MULTIVITAMIN WITH MINERALS) tablet Take 1 tablet by mouth daily.   Marland Kitchen neomycin-polymyxin-gramicidin (NEOSPORIN) 1.75-10000-.025 ophthalmic solution Place 1 drop into both eyes 4 (four) times daily.  . nitroGLYCERIN  (NITROSTAT) 0.4 MG SL tablet Place 1 tablet (0.4 mg total) under the tongue every 5 (five) minutes as needed for chest pain (up to 3 doses).  . Omega-3 Krill Oil 500 MG CAPS Take by mouth daily.  . polyvinyl alcohol (LIQUIFILM TEARS) 1.4 % ophthalmic solution Place 2 drops into both eyes as needed for dry eyes.  . potassium chloride (K-DUR) 10 MEQ tablet Take 10 mEq by mouth daily as needed. Take 1 capsule by mouth daily with the use of lasix as directed  . rosuvastatin (CRESTOR) 40 MG tablet Take 1 tablet (40 mg total) by mouth every evening.  . ticagrelor (BRILINTA) 90 MG TABS tablet Take 1 tablet (90 mg total) by mouth 2 (two) times daily.  Marland Kitchen tiZANidine (ZANAFLEX) 4 MG tablet Take 4 mg by mouth every 8 (eight) hours as needed for muscle spasms.  . Turmeric 500 MG TABS Take by mouth 2 (two) times daily.     Allergies:   Codeine; Fosamax [alendronate sodium]; and Simvastatin   Social History  Socioeconomic History  . Marital status: Widowed    Spouse name: Not on file  . Number of children: Not on file  . Years of education: Not on file  . Highest education level: Not on file  Occupational History  . Not on file  Social Needs  . Financial resource strain: Not on file  . Food insecurity:    Worry: Not on file    Inability: Not on file  . Transportation needs:    Medical: Not on file    Non-medical: Not on file  Tobacco Use  . Smoking status: Never Smoker  . Smokeless tobacco: Never Used  Substance and Sexual Activity  . Alcohol use: No    Frequency: Never  . Drug use: No  . Sexual activity: Never    Birth control/protection: Surgical  Lifestyle  . Physical activity:    Days per week: Not on file    Minutes per session: Not on file  . Stress: Not on file  Relationships  . Social connections:    Talks on phone: Not on file    Gets together: Not on file    Attends religious service: Not on file    Active member of club or organization: Not on file    Attends meetings  of clubs or organizations: Not on file    Relationship status: Not on file  Other Topics Concern  . Not on file  Social History Narrative  . Not on file     Family History: The patient's family history is not on file. ROS:   Please see the history of present illness.    All 14 point review of systems negative except as described per history of present illness  EKGs/Labs/Other Studies Reviewed:      Recent Labs: 05/14/2017: TSH 1.711 05/15/2017: Hemoglobin 12.3; Platelets 234 06/22/2017: ALT 19; BUN 11; Creatinine, Ser 0.76; Potassium 4.7; Sodium 142  Recent Lipid Panel    Component Value Date/Time   CHOL 134 06/22/2017 1522   TRIG 76 06/22/2017 1522   HDL 48 06/22/2017 1522   CHOLHDL 2.8 06/22/2017 1522   CHOLHDL 4.9 05/14/2017 0247   VLDL 18 05/14/2017 0247   LDLCALC 71 06/22/2017 1522    Physical Exam:    VS:  BP 118/70 (BP Location: Right Arm, Patient Position: Sitting, Cuff Size: Normal)   Pulse 74   Ht 5\' 3"  (1.6 m)   Wt 179 lb 9.6 oz (81.5 kg)   SpO2 95%   BMI 31.81 kg/m     Wt Readings from Last 3 Encounters:  10/21/17 179 lb 9.6 oz (81.5 kg)  06/22/17 176 lb 6.4 oz (80 kg)  05/25/17 176 lb 1.6 oz (79.9 kg)     GEN:  Well nourished, well developed in no acute distress HEENT: Normal NECK: No JVD; No carotid bruits LYMPHATICS: No lymphadenopathy CARDIAC: RRR, no murmurs, no rubs, no gallops RESPIRATORY:  Clear to auscultation without rales, wheezing or rhonchi  ABDOMEN: Soft, non-tender, non-distended MUSCULOSKELETAL:  No edema; No deformity  SKIN: Warm and dry LOWER EXTREMITIES: no swelling NEUROLOGIC:  Alert and oriented x 3 PSYCHIATRIC:  Normal affect   ASSESSMENT:    1. Essential hypertension   2. CAD in native artery   3. PAD (peripheral artery disease) (HCC)    PLAN:    In order of problems listed above:  1. Essential hypertension blood pressure well controlled continue present management. 2. Coronary artery disease status post PTCA and  stenting done 2 months ago doing  well on appropriate medication will continue dual antiplatelet agent to complete 1 year therapy 3. Peripheral vascular disease stable. 4. Dyslipidemia: Her cholesterol is well controlled continue present management   Medication Adjustments/Labs and Tests Ordered: Current medicines are reviewed at length with the patient today.  Concerns regarding medicines are outlined above.  No orders of the defined types were placed in this encounter.  Medication changes: No orders of the defined types were placed in this encounter.   Signed, Park Liter, MD, [facility] 10/21/2017 4:17 PM    [facility]

## 2017-10-21 NOTE — Patient Instructions (Signed)
Medication Instructions:  Your physician recommends that you continue on your current medications as directed. Please refer to the Current Medication list given to you today.  Labwork: None  Testing/Procedures: None  Follow-Up: Your physician recommends that you schedule a follow-up appointment in: 5 months  Any Other Special Instructions Will Be Listed Below (If Applicable).     If you need a refill on your cardiac medications before your next appointment, please call your pharmacy.   [facility]  Georgianna Band A, RN, BSN  

## 2017-12-06 DIAGNOSIS — E782 Mixed hyperlipidemia: Secondary | ICD-10-CM | POA: Diagnosis not present

## 2017-12-06 DIAGNOSIS — Z8673 Personal history of transient ischemic attack (TIA), and cerebral infarction without residual deficits: Secondary | ICD-10-CM | POA: Diagnosis not present

## 2017-12-06 DIAGNOSIS — M19041 Primary osteoarthritis, right hand: Secondary | ICD-10-CM | POA: Diagnosis not present

## 2017-12-06 DIAGNOSIS — M19042 Primary osteoarthritis, left hand: Secondary | ICD-10-CM | POA: Diagnosis not present

## 2017-12-06 DIAGNOSIS — M159 Polyosteoarthritis, unspecified: Secondary | ICD-10-CM | POA: Diagnosis not present

## 2017-12-06 DIAGNOSIS — M5126 Other intervertebral disc displacement, lumbar region: Secondary | ICD-10-CM | POA: Diagnosis not present

## 2017-12-06 DIAGNOSIS — I63512 Cerebral infarction due to unspecified occlusion or stenosis of left middle cerebral artery: Secondary | ICD-10-CM | POA: Diagnosis not present

## 2017-12-06 DIAGNOSIS — R7301 Impaired fasting glucose: Secondary | ICD-10-CM | POA: Diagnosis not present

## 2017-12-06 DIAGNOSIS — I10 Essential (primary) hypertension: Secondary | ICD-10-CM | POA: Diagnosis not present

## 2018-03-01 ENCOUNTER — Other Ambulatory Visit: Payer: Self-pay | Admitting: Cardiology

## 2018-03-07 DIAGNOSIS — M159 Polyosteoarthritis, unspecified: Secondary | ICD-10-CM | POA: Diagnosis not present

## 2018-03-07 DIAGNOSIS — E782 Mixed hyperlipidemia: Secondary | ICD-10-CM | POA: Diagnosis not present

## 2018-03-07 DIAGNOSIS — Z8673 Personal history of transient ischemic attack (TIA), and cerebral infarction without residual deficits: Secondary | ICD-10-CM | POA: Diagnosis not present

## 2018-03-07 DIAGNOSIS — R7301 Impaired fasting glucose: Secondary | ICD-10-CM | POA: Diagnosis not present

## 2018-03-07 DIAGNOSIS — I10 Essential (primary) hypertension: Secondary | ICD-10-CM | POA: Diagnosis not present

## 2018-03-07 DIAGNOSIS — M5126 Other intervertebral disc displacement, lumbar region: Secondary | ICD-10-CM | POA: Diagnosis not present

## 2018-03-07 DIAGNOSIS — I63512 Cerebral infarction due to unspecified occlusion or stenosis of left middle cerebral artery: Secondary | ICD-10-CM | POA: Diagnosis not present

## 2018-03-16 DIAGNOSIS — L219 Seborrheic dermatitis, unspecified: Secondary | ICD-10-CM | POA: Diagnosis not present

## 2018-03-16 DIAGNOSIS — R531 Weakness: Secondary | ICD-10-CM | POA: Diagnosis not present

## 2018-05-02 DIAGNOSIS — L219 Seborrheic dermatitis, unspecified: Secondary | ICD-10-CM | POA: Diagnosis not present

## 2018-05-30 DIAGNOSIS — K0889 Other specified disorders of teeth and supporting structures: Secondary | ICD-10-CM | POA: Diagnosis not present

## 2018-05-30 DIAGNOSIS — H6123 Impacted cerumen, bilateral: Secondary | ICD-10-CM | POA: Diagnosis not present

## 2018-06-06 DIAGNOSIS — H353131 Nonexudative age-related macular degeneration, bilateral, early dry stage: Secondary | ICD-10-CM | POA: Diagnosis not present

## 2018-06-06 DIAGNOSIS — H35371 Puckering of macula, right eye: Secondary | ICD-10-CM | POA: Diagnosis not present

## 2018-06-06 DIAGNOSIS — H25013 Cortical age-related cataract, bilateral: Secondary | ICD-10-CM | POA: Diagnosis not present

## 2018-06-06 DIAGNOSIS — H2513 Age-related nuclear cataract, bilateral: Secondary | ICD-10-CM | POA: Diagnosis not present

## 2018-06-08 DIAGNOSIS — Z8673 Personal history of transient ischemic attack (TIA), and cerebral infarction without residual deficits: Secondary | ICD-10-CM | POA: Diagnosis not present

## 2018-06-08 DIAGNOSIS — R350 Frequency of micturition: Secondary | ICD-10-CM | POA: Diagnosis not present

## 2018-06-08 DIAGNOSIS — I10 Essential (primary) hypertension: Secondary | ICD-10-CM | POA: Diagnosis not present

## 2018-06-08 DIAGNOSIS — M159 Polyosteoarthritis, unspecified: Secondary | ICD-10-CM | POA: Diagnosis not present

## 2018-06-08 DIAGNOSIS — R5383 Other fatigue: Secondary | ICD-10-CM | POA: Diagnosis not present

## 2018-06-08 DIAGNOSIS — M5126 Other intervertebral disc displacement, lumbar region: Secondary | ICD-10-CM | POA: Diagnosis not present

## 2018-06-08 DIAGNOSIS — E782 Mixed hyperlipidemia: Secondary | ICD-10-CM | POA: Diagnosis not present

## 2018-06-08 DIAGNOSIS — E559 Vitamin D deficiency, unspecified: Secondary | ICD-10-CM | POA: Diagnosis not present

## 2018-06-08 DIAGNOSIS — L659 Nonscarring hair loss, unspecified: Secondary | ICD-10-CM | POA: Diagnosis not present

## 2018-06-14 ENCOUNTER — Encounter: Payer: Self-pay | Admitting: Cardiology

## 2018-06-14 ENCOUNTER — Ambulatory Visit (INDEPENDENT_AMBULATORY_CARE_PROVIDER_SITE_OTHER): Payer: PPO | Admitting: Cardiology

## 2018-06-14 VITALS — BP 140/70 | HR 68 | Ht 63.0 in | Wt 183.8 lb

## 2018-06-14 DIAGNOSIS — I739 Peripheral vascular disease, unspecified: Secondary | ICD-10-CM

## 2018-06-14 DIAGNOSIS — R7401 Elevation of levels of liver transaminase levels: Secondary | ICD-10-CM

## 2018-06-14 DIAGNOSIS — R740 Nonspecific elevation of levels of transaminase and lactic acid dehydrogenase [LDH]: Secondary | ICD-10-CM | POA: Diagnosis not present

## 2018-06-14 DIAGNOSIS — Z8673 Personal history of transient ischemic attack (TIA), and cerebral infarction without residual deficits: Secondary | ICD-10-CM | POA: Diagnosis not present

## 2018-06-14 DIAGNOSIS — I2510 Atherosclerotic heart disease of native coronary artery without angina pectoris: Secondary | ICD-10-CM

## 2018-06-14 HISTORY — DX: Elevation of levels of liver transaminase levels: R74.01

## 2018-06-14 NOTE — Patient Instructions (Addendum)
Medication Instructions:  STOP taking Brillinta STOP taking tumeric STOP taking crestor If you need a refill on your cardiac medications before your next appointment, please call your pharmacy.   Lab work: Your physician recommends that you have a hepatic function drawn in 2 weeks. You do not need to have an appointment. You can walk in anytime between 8-4 except 12-1.    If you have labs (blood work) drawn today and your tests are completely normal, you will receive your results only by: Marland Kitchen MyChart Message (if you have MyChart) OR . A paper copy in the mail If you have any lab test that is abnormal or we need to change your treatment, we will call you to review the results.  Testing/Procedures: NONE  Follow-Up: At [facility], you and your health needs are our priority.  As part of our continuing mission to provide you with exceptional heart care, we have created designated Provider Care Teams.  These Care Teams include your primary Cardiologist (physician) and Advanced Practice Providers (APPs -  Physician Assistants and Nurse Practitioners) who all work together to provide you with the care you need, when you need it. You will need a follow up appointment in 1 months.

## 2018-06-14 NOTE — Progress Notes (Signed)
Cardiology Office Note:    Date:  06/14/2018   ID:  Theresa Prince, DOB [birth year], MRN [id number]  PCP:  Manfred Shirts, PA  Cardiologist:  Jenne Campus, MD    Referring MD: Manfred Shirts, [place]   Chief Complaint  Patient presents with  . Medication has Liver function elevated  I have of problem and liver  History of Present Illness:    Theresa Prince is a 78 y.o. female with coronary disease status post PTCA and stenting done in February of last year she was referred back to [place] because the significant liver function test abnormality and thinking is that may be related to her cholesterol medication on Brilinta.  Interestingly she is been taking those medications for long time already obviously without Brilinta but last liver function test I see is from November of last year which were perfectly normal in spite of the fact she is been taking Brilinta already for 11 months and Crestor for even longer.  Still I think it would be reasonable to hold his medications she also takes turmeric I asked her to stop taking it.  She use Tylenol very rarely but she was advised not to take it.  She does not drink.  She does not eat any unusual food.  Denies have any fever chills nausea vomiting or symptoms suggesting some significant liver failure.  Past Medical History:  Diagnosis Date  . Acute eczematoid otitis externa of both ears 06/30/2016  . Alopecia 01/27/2014  . Anxiety   . Arthritis    "all over" (05/13/2017)  . Bulging lumbar disc 01/27/2014  . CAD in native artery    a. Cardiac cath 05/14/17 showed multivessel disease with 25% Prox-distal RCA, 75% rPDA, 95% OM2 (treated with PTCA), 90% prox-mid Cx (treated with DES), 25% prox LAD< 80% mLAD (treated with DES), 50% mid-distal LAD, 80% distal LAD, LVEDP normal, LVEF 55-65%.  . Cancer ([place])    cervical (pre-cancerous)  . 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 Chronic lower back pain     . Clumsiness 02/27/2016  . Degenerative arthritis 01/27/2014  . Depression   . Diverticulosis   . Generalized osteoarthritis of multiple sites 07/17/2014  . High cholesterol   . History of stroke without residual deficits 07/17/2014  . Hypertension   . Ingrown nail 03/17/2017  . Lichen sclerosus 59/16/3846  . Mixed emotional features as adjustment reaction 01/27/2014  . Nasal congestion 06/30/2016  . Nasal vestibulitis 06/30/2016  . Neuralgia and neuritis 01/27/2014   Overview:  Overview:  Right anterior thigh Overview:  Right anterior thigh  . NSTEMI (non-ST elevated myocardial infarction) ([place]) 05/13/2017  . Osteopenia 01/27/2014  . PAD (peripheral artery disease) ([place])    a. CT 04/2017 showed peripheral calcified and noncalcified atherosclerotic plaque with atherosclerotic narrowing of the splenic artery and atherosclerotic narrowing  . Pain in toes of both feet 03/17/2017  . Perennial allergic rhinitis with seasonal variation 07/17/2014  . Perioral dermatitis 12/02/2016  . PFO (patent foramen ovale)    a. noted on echo 04/2017.  Marland Kitchen Pneumonia    "twice" (05/13/2017)  . Postlaminectomy syndrome, lumbar 01/27/2014  . Primary osteoarthritis of right knee 01/27/2014  . Slow transit constipation 07/17/2014  . Stroke [facility]) 2014   "speech issues; mostly resolved; still have trouble pronouncing some words" (05/13/2017)  . Trochanteric bursitis of both hips 07/17/2014  . Varicose vein of leg   . Varicose veins 01/27/2014  . VIN III (  vulvar intraepithelial neoplasia III) 01/27/2014  . Vitamin D deficiency 01/27/2014    Past Surgical History:  Procedure Laterality Date  . BACK SURGERY    . CORONARY BALLOON ANGIOPLASTY N/A 05/14/2017   Procedure: CORONARY BALLOON ANGIOPLASTY;  Surgeon: Jettie Booze, MD;  Location: [place] [facility];  Service: Cardiovascular;  Laterality: N/A;  OM2  . CORONARY STENT INTERVENTION N/A 05/14/2017   Procedure: CORONARY STENT INTERVENTION;  Surgeon: Jettie Booze, MD;  Location: [place] [facility];  Service: Cardiovascular;  Laterality: N/A;  . CYST EXCISION  1999   "took off my spine"  . LEFT HEART CATH AND CORONARY ANGIOGRAPHY N/A 05/14/2017   Procedure: LEFT HEART CATH AND CORONARY ANGIOGRAPHY;  Surgeon: Jettie Booze, MD;  Location: [place] [facility];  Service: Cardiovascular;  Laterality: N/A;  . VAGINAL HYSTERECTOMY  1982    Current Medications: Current Meds  Medication Sig  . acetaminophen (TYLENOL) 650 MG CR tablet Take 650 mg by mouth every 8 (eight) hours as needed for pain.  Marland Kitchen amLODipine (NORVASC) 10 MG tablet Take 10 mg by mouth daily.   Marland Kitchen aspirin EC 81 MG tablet Take 81 mg by mouth daily.  Marland Kitchen BRILINTA 90 MG TABS tablet TAKE 1 TABLET BY MOUTH 2 TIMES A DAY  . calcium carbonate (OS-CAL - DOSED IN MG OF ELEMENTAL CALCIUM) 1250 (500 Ca) MG tablet Take 1 tablet by mouth daily.  . calcium carbonate (TUMS - DOSED IN MG ELEMENTAL CALCIUM) 500 MG chewable tablet Chew 2 tablets by mouth 2 (two) times daily as needed for indigestion or heartburn.  . Calcium Citrate-Vitamin D (CALCIUM CITRATE+D3 PETITES PO) Take by mouth 2 (two) times daily.  . cholecalciferol (VITAMIN D) 1000 units tablet Take 1,000 Units by mouth daily.  . clotrimazole-betamethasone (LOTRISONE) cream Apply 1 application topically 2 (two) times daily.  . diazepam (VALIUM) 10 MG tablet Take 10 mg by mouth every 6 (six) hours as needed for anxiety.  . DOCOSAHEXAENOIC ACID PO Take by mouth.  . docusate sodium (COLACE) 100 MG capsule Take 200 mg by mouth at bedtime.  Marland Kitchen doxycycline (PERIOSTAT) 20 MG tablet Take 20 mg by mouth 2 (two) times daily.  . enalapril (VASOTEC) 20 MG tablet Take 20 mg by mouth daily.  . fluticasone (FLONASE) 50 MCG/ACT nasal spray Place 1 spray into both nostrils daily.  . furosemide (LASIX) 20 MG tablet Take 20 mg by mouth daily as needed. fluid  . HYDROcodone-acetaminophen (NORCO/VICODIN) 5-325 MG tablet Take 1 tablet by mouth 2 (two) times  daily.   Marland Kitchen loratadine (CLARITIN) 10 MG tablet Take 10 mg by mouth daily. Only takes in Summer  . meclizine (ANTIVERT) 12.5 MG tablet Take 12.5 mg by mouth 3 (three) times daily as needed for dizziness.  . metoprolol succinate (TOPROL-XL) 25 MG 24 hr tablet Take 25 mg by mouth daily.  . metroNIDAZOLE (METROGEL) 0.75 % gel Apply 1 application topically 2 (two) times daily.  . montelukast (SINGULAIR) 10 MG tablet Take 10 mg by mouth at bedtime.  . Multiple Vitamins-Minerals (MULTIVITAMIN WITH MINERALS) tablet Take 1 tablet by mouth daily.   Marland Kitchen neomycin-polymyxin-gramicidin (NEOSPORIN) 1.75-10000-.025 ophthalmic solution Place 1 drop into both eyes 4 (four) times daily.  . nitroGLYCERIN (NITROSTAT) 0.4 MG SL tablet Place 1 tablet (0.4 mg total) under the tongue every 5 (five) minutes as needed for chest pain (up to 3 doses).  . Omega-3 Krill Oil 500 MG CAPS Take by mouth daily.  . polyvinyl alcohol (LIQUIFILM TEARS)  1.4 % ophthalmic solution Place 2 drops into both eyes as needed for dry eyes.  . potassium chloride (K-DUR) 10 MEQ tablet Take 10 mEq by mouth daily as needed. Take 1 capsule by mouth daily with the use of lasix as directed  . rosuvastatin (CRESTOR) 40 MG tablet Take 1 tablet (40 mg total) by mouth every evening.  Marland Kitchen tiZANidine (ZANAFLEX) 4 MG tablet Take 4 mg by mouth every 8 (eight) hours as needed for muscle spasms.  . Turmeric 500 MG TABS Take by mouth 2 (two) times daily.     Allergies:   Codeine; Fosamax [alendronate sodium]; and Simvastatin   Social History   Socioeconomic History  . Marital status: Widowed    Spouse name: Not on file  . Number of children: Not on file  . Years of education: Not on file  . Highest education level: Not on file  Occupational History  . Not on file  Social Needs  . Financial resource strain: Not on file  . Food insecurity:    Worry: Not on file    Inability: Not on file  . Transportation needs:    Medical: Not on file    Non-medical:  Not on file  Tobacco Use  . Smoking status: Never Smoker  . Smokeless tobacco: Never Used  Substance and Sexual Activity  . Alcohol use: No    Frequency: Never  . Drug use: No  . Sexual activity: Never    Birth control/protection: Surgical  Lifestyle  . Physical activity:    Days per week: Not on file    Minutes per session: Not on file  . Stress: Not on file  Relationships  . Social connections:    Talks on phone: Not on file    Gets together: Not on file    Attends religious service: Not on file    Active member of club or organization: Not on file    Attends meetings of clubs or organizations: Not on file    Relationship status: Not on file  Other Topics Concern  . Not on file  Social History Narrative  . Not on file     Family History: The patient's family history is not on file. ROS:   Please see the history of present illness.    All 14 point review of systems negative except as described per history of present illness  EKGs/Labs/Other Studies Reviewed:      Recent Labs: 06/22/2017: ALT 19; BUN 11; Creatinine, Ser 0.76; Potassium 4.7; Sodium 142  Recent Lipid Panel    Component Value Date/Time   CHOL 134 06/22/2017 1522   TRIG 76 06/22/2017 1522   HDL 48 06/22/2017 1522   CHOLHDL 2.8 06/22/2017 1522   CHOLHDL 4.9 05/14/2017 0247   VLDL 18 05/14/2017 0247   LDLCALC 71 06/22/2017 1522    Physical Exam:    VS:  BP 140/70   Pulse 68   Ht 5\' 3"  (1.6 m)   Wt 183 lb 12.8 oz (83.4 kg)   SpO2 96%   BMI 32.56 kg/m     Wt Readings from Last 3 Encounters:  06/14/18 183 lb 12.8 oz (83.4 kg)  10/21/17 179 lb 9.6 oz (81.5 kg)  06/22/17 176 lb 6.4 oz (80 kg)     GEN:  Well nourished, well developed in no acute distress HEENT: Normal NECK: No JVD; No carotid bruits LYMPHATICS: No lymphadenopathy CARDIAC: RRR, no murmurs, no rubs, no gallops RESPIRATORY:  Clear to auscultation without rales, wheezing  or rhonchi  ABDOMEN: Soft, non-tender,  non-distended MUSCULOSKELETAL:  No edema; No deformity  SKIN: Warm and dry LOWER EXTREMITIES: no swelling NEUROLOGIC:  Alert and oriented x 3 PSYCHIATRIC:  Normal affect   ASSESSMENT:    1. CAD in native artery   2. History of stroke without residual deficits   3. PAD (peripheral artery disease) (HCC)   4. Elevated transaminase level    PLAN:    In order of problems listed above:  1. Coronary disease stable from that point review asymptomatic on appropriate medications.  Brilinta will be stopped it is already more than year after stent implantation so need to be discontinued anyway. 2. History of CVA stable.  Last cardiac ultrasound showed no critical stenosis. 3. Peripheral vascular disease her carotid arteries showing no significant stenosis 4. Elevated transaminase.  Unclear for now only mild elevation still required discontinuation of Crestor as well as Brilinta.  Avoiding Tylenol avoiding alcohol.  Will check AST ALT in 2 weeks.   Medication Adjustments/Labs and Tests Ordered: Current medicines are reviewed at length with the patient today.  Concerns regarding medicines are outlined above.  No orders of the defined types were placed in this encounter.  Medication changes: No orders of the defined types were placed in this encounter.   Signed, Park Liter, MD, [facility] 06/14/2018 3:05 PM    [place]

## 2018-06-29 ENCOUNTER — Telehealth: Payer: Self-pay | Admitting: Cardiology

## 2018-06-29 ENCOUNTER — Ambulatory Visit: Payer: PPO | Admitting: Cardiology

## 2018-06-29 ENCOUNTER — Other Ambulatory Visit: Payer: Self-pay | Admitting: Cardiology

## 2018-06-29 NOTE — Telephone Encounter (Signed)
Consulted with Dr. Agustin Cree he advises patient to still have lab work drawn. Patient verbally understands.

## 2018-06-29 NOTE — Telephone Encounter (Signed)
Patient is supposed to have liver bloodwork done this week but wants to know if it can wait a while because she does not want to come out right now.  Please call patient to discuss

## 2018-07-01 DIAGNOSIS — I739 Peripheral vascular disease, unspecified: Secondary | ICD-10-CM | POA: Diagnosis not present

## 2018-07-01 DIAGNOSIS — I2510 Atherosclerotic heart disease of native coronary artery without angina pectoris: Secondary | ICD-10-CM | POA: Diagnosis not present

## 2018-07-01 DIAGNOSIS — Z8673 Personal history of transient ischemic attack (TIA), and cerebral infarction without residual deficits: Secondary | ICD-10-CM | POA: Diagnosis not present

## 2018-07-02 LAB — HEPATIC FUNCTION PANEL
ALT: 40 IU/L — AB (ref 0–32)
AST: 46 IU/L — AB (ref 0–40)
Albumin: 4.6 g/dL (ref 3.7–4.7)
Alkaline Phosphatase: 218 IU/L — ABNORMAL HIGH (ref 39–117)
BILIRUBIN TOTAL: 0.5 mg/dL (ref 0.0–1.2)
Bilirubin, Direct: 0.19 mg/dL (ref 0.00–0.40)
Total Protein: 6.8 g/dL (ref 6.0–8.5)

## 2018-07-04 ENCOUNTER — Encounter: Payer: Self-pay | Admitting: Emergency Medicine

## 2018-07-15 ENCOUNTER — Telehealth: Payer: Self-pay | Admitting: Emergency Medicine

## 2018-07-15 NOTE — Telephone Encounter (Signed)
TELEPHONE CALL NOTE  Theresa Prince has been deemed a candidate for a follow-up tele-health visit to limit community exposure during the Covid-19 pandemic. I spoke with the patient via phone to ensure availability of phone/video source, confirm preferred email & phone number, and discuss instructions and expectations.  I reminded Theresa Prince to be prepared with any vital sign and/or heart rhythm information that could potentially be obtained via home monitoring, at the time of her visit. I reminded Theresa Prince to expect a phone call at the time of her visit if her visit.  Did the patient verbally acknowledge consent to treatment? yes Theresa Prince 07/15/2018 11:24 AM   CONSENT FOR TELE-HEALTH VISIT - PLEASE REVIEW  I hereby voluntarily request, consent and authorize [facility] and its employed or contracted physicians, physician assistants, nurse practitioners or other licensed health care professionals (the Practitioner), to provide me with telemedicine health care services (the Services") as deemed necessary by the treating Practitioner. I acknowledge and consent to receive the Services by the Practitioner via telemedicine. I understand that the telemedicine visit will involve communicating with the Practitioner through live audiovisual communication technology and the disclosure of certain medical information by electronic transmission. I acknowledge that I have been given the opportunity to request an in-person assessment or other available alternative prior to the telemedicine visit and am voluntarily participating in the telemedicine visit.  I understand that I have the right to withhold or withdraw my consent to the use of telemedicine in the course of my care at any time, without affecting my right to future care or treatment, and that the Practitioner or I may terminate the telemedicine visit at any time. I understand that I have the right to inspect all information  obtained and/or recorded in the course of the telemedicine visit and may receive copies of available information for a reasonable fee.  I understand that some of the potential risks of receiving the Services via telemedicine include:   Delay or interruption in medical evaluation due to technological equipment failure or disruption;  Information transmitted may not be sufficient (e.g. poor resolution of images) to allow for appropriate medical decision making by the Practitioner; and/or   In rare instances, security protocols could fail, causing a breach of personal health information.  Furthermore, I acknowledge that it is my responsibility to provide information about my medical history, conditions and care that is complete and accurate to the best of my ability. I acknowledge that Practitioner's advice, recommendations, and/or decision may be based on factors not within their control, such as incomplete or inaccurate data provided by me or distortions of diagnostic images or specimens that may result from electronic transmissions. I understand that the practice of medicine is not an exact science and that Practitioner makes no warranties or guarantees regarding treatment outcomes. I acknowledge that I will receive a copy of this consent concurrently upon execution via email to the email address I last provided but may also request a printed copy by calling the office of [place].    I understand that my insurance will be billed for this visit.   I have read or had this consent read to me.  I understand the contents of this consent, which adequately explains the benefits and risks of the Services being provided via telemedicine.   I have been provided ample opportunity to ask questions regarding this consent and the Services and have had my questions answered to my satisfaction.  I give my informed  consent for the services to be provided through the use of telemedicine in my medical  care  By participating in this telemedicine visit I agree to the above. YES        Cardiac Questionnaire:    Since your last visit or hospitalization:    1. Have you been having new or worsening chest pain? NO   2. Have you been having new or worsening shortness of breath? NO 3. Have you been having new or worsening leg swelling, wt gain, or increase in abdominal girth (pants fitting more tightly)? NO   4. Have you had any passing out spells? NO    *A YES to any of these questions would result in the appointment being kept. *If all the answers to these questions are NO, we should indicate that given the current situation regarding the worldwide coronarvirus pandemic, at the recommendation of the CDC, we are looking to limit gatherings in our waiting area, and thus will reschedule their appointment beyond four weeks from today.   _____________   COVID-19 Pre-Screening Questions:   Do you currently have a fever? NO  Have you recently travelled on a cruise, internationally, or to [place], [place], [place], [place], [place], or [place], [place] [employer]) ? NO  Have you been in contact with someone that is currently pending confirmation of Covid19 testing or has been confirmed to have the [place] virus?  NO  Are you currently experiencing fatigue or cough? NO

## 2018-07-15 NOTE — Telephone Encounter (Signed)
Left message for patient to return call. She will need consent for televisit.

## 2018-07-20 ENCOUNTER — Other Ambulatory Visit: Payer: Self-pay

## 2018-07-20 ENCOUNTER — Telehealth (INDEPENDENT_AMBULATORY_CARE_PROVIDER_SITE_OTHER): Payer: PPO | Admitting: Cardiology

## 2018-07-20 VITALS — BP 130/80 | HR 68 | Wt 180.0 lb

## 2018-07-20 DIAGNOSIS — I2510 Atherosclerotic heart disease of native coronary artery without angina pectoris: Secondary | ICD-10-CM | POA: Diagnosis not present

## 2018-07-20 DIAGNOSIS — I10 Essential (primary) hypertension: Secondary | ICD-10-CM

## 2018-07-20 DIAGNOSIS — R7401 Elevation of levels of liver transaminase levels: Secondary | ICD-10-CM

## 2018-07-20 DIAGNOSIS — E7800 Pure hypercholesterolemia, unspecified: Secondary | ICD-10-CM

## 2018-07-20 DIAGNOSIS — R740 Nonspecific elevation of levels of transaminase and lactic acid dehydrogenase [LDH]: Secondary | ICD-10-CM

## 2018-07-20 NOTE — Patient Instructions (Addendum)
Medication Instructions:  Your physician recommends that you continue on your current medications as directed. Please refer to the Current Medication list given to you today.  If you need a refill on your cardiac medications before your next appointment, please call your pharmacy.   Lab work: Your physician recommends that you return for lab work at least 1 wk prior to your appointment Hepatic function test  If you have labs (blood work) drawn today and your tests are completely normal, you will receive your results only by: Marland Kitchen MyChart Message (if you have MyChart) OR . A paper copy in the mail If you have any lab test that is abnormal or we need to change your treatment, we will call you to review the results.  Testing/Procedures: None  Follow-Up: At [facility], you and your health needs are our priority.  As part of our continuing mission to provide you with exceptional heart care, we have created designated Provider Care Teams.  These Care Teams include your primary Cardiologist (physician) and Advanced Practice Providers (APPs -  Physician Assistants and Nurse Practitioners) who all work together to provide you with the care you need, when you need it. You will need a follow up appointment in 1 months.  Please call our office 2 months in advance to schedule this appointment.  You may see No primary care provider on file. or another member of our [employer] Provider Team in : Shirlee More, MD . Jyl Heinz, MD  Any Other Special Instructions Will Be Listed Below (If Applicable).

## 2018-07-20 NOTE — Progress Notes (Signed)
Virtual Visit via Telephone Note   This visit type was conducted due to national recommendations for restrictions regarding the COVID-19 Pandemic (e.g. social distancing) in an effort to limit this patient's exposure and mitigate transmission in our community.  Due to her co-morbid illnesses, this patient is at least at moderate risk for complications without adequate follow up.  This format is felt to be most appropriate for this patient at this time.  The patient did not have access to video technology/had technical difficulties with video requiring transitioning to audio format only (telephone).  All issues noted in this document were discussed and addressed.  No physical exam could be performed with this format.  Please refer to the patient's chart for her  consent to telehealth for [facility].  Evaluation Performed:  Follow-up visit   Date:  07/12/9377  ID: Theresa Prince, DOB [birth year], MRN [id number]   Patient Location:  [address] [place] [address]   Provider location:   [place] Office  PCP:  Manfred Shirts, [place]  Cardiologist:  Jenne Campus, MD     Chief Complaint: I am doing better  History of Present Illness:    Theresa Prince is a 78 y.o. female  who presents via audio/video conferencing for a telehealth visit today.  With coronary disease status post PCTA and stenting of the circumflex/obtuse marginal LAD done in May 14, 2017.  She has been doing quite well however recently she was noted to have significant liver function test elevation we decided to stop her statin as well as Brilinta.  It was already time to stop her bloating tight was more than 1 year after stent implantation she was also taking a lot of turmeric and asked her to stop it.  Her liver function tests gradually improved.  Overall she is doing well denies have any chest pain tightness squeezing pressure burning chest.  She also denies having any abdominal problems.  She does have  some constipation but this is a chronic issue.  She is trying to cope with the stressful situation of coronavirus bandemia.  Seems to be doing quite well with this.  She sometimes goes outside.   The patient does not have symptoms concerning for COVID-19 infection (fever, chills, cough, or new SHORTNESS OF BREATH).    Prior CV studies:   The following studies were reviewed today:       Past Medical History:  Diagnosis Date  . Acute eczematoid otitis externa of both ears 06/30/2016  . Alopecia 01/27/2014  . Anxiety   . Arthritis    "all over" (05/13/2017)  . Bulging lumbar disc 01/27/2014  . CAD in native artery    a. Cardiac cath 05/14/17 showed multivessel disease with 25% Prox-distal RCA, 75% rPDA, 95% OM2 (treated with PTCA), 90% prox-mid Cx (treated with DES), 25% prox LAD< 80% mLAD (treated with DES), 50% mid-distal LAD, 80% distal LAD, LVEDP normal, LVEF 55-65%.  . Cancer ([place])    cervical (pre-cancerous)  . 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 Chronic lower back pain   . Clumsiness 02/27/2016  . Degenerative arthritis 01/27/2014  . Depression   . Diverticulosis   . Generalized osteoarthritis of multiple sites 07/17/2014  . High cholesterol   . History of stroke without residual deficits 07/17/2014  . Hypertension   . Ingrown nail 03/17/2017  . Lichen sclerosus 96/22/2979  . Mixed emotional features as adjustment reaction 01/27/2014  .  Nasal congestion 06/30/2016  . Nasal vestibulitis 06/30/2016  . Neuralgia and neuritis 01/27/2014   Overview:  Overview:  Right anterior thigh Overview:  Right anterior thigh  . NSTEMI (non-ST elevated myocardial infarction) ([place]) 05/13/2017  . Osteopenia 01/27/2014  . PAD (peripheral artery disease) ([place])    a. CT 04/2017 showed peripheral calcified and noncalcified atherosclerotic plaque with atherosclerotic narrowing of the splenic artery and atherosclerotic narrowing  . Pain in  toes of both feet 03/17/2017  . Perennial allergic rhinitis with seasonal variation 07/17/2014  . Perioral dermatitis 12/02/2016  . PFO (patent foramen ovale)    a. noted on echo 04/2017.  Marland Kitchen Pneumonia    "twice" (05/13/2017)  . Postlaminectomy syndrome, lumbar 01/27/2014  . Primary osteoarthritis of right knee 01/27/2014  . Slow transit constipation 07/17/2014  . Stroke [facility]) 2014   "speech issues; mostly resolved; still have trouble pronouncing some words" (05/13/2017)  . Trochanteric bursitis of both hips 07/17/2014  . Varicose vein of leg   . Varicose veins 01/27/2014  . 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 Vitamin D deficiency 01/27/2014    Past Surgical History:  Procedure Laterality Date  . BACK SURGERY    . CORONARY BALLOON ANGIOPLASTY N/A 05/14/2017   Procedure: CORONARY BALLOON ANGIOPLASTY;  Surgeon: Jettie Booze, MD;  Location: [place] [facility];  Service: Cardiovascular;  Laterality: N/A;  OM2  . CORONARY STENT INTERVENTION N/A 05/14/2017   Procedure: CORONARY STENT INTERVENTION;  Surgeon: Jettie Booze, MD;  Location: [place] [facility];  Service: Cardiovascular;  Laterality: N/A;  . CYST EXCISION  1999   "took off my spine"  . LEFT HEART CATH AND CORONARY ANGIOGRAPHY N/A 05/14/2017   Procedure: LEFT HEART CATH AND CORONARY ANGIOGRAPHY;  Surgeon: Jettie Booze, MD;  Location: [place] [facility];  Service: Cardiovascular;  Laterality: N/A;  . VAGINAL HYSTERECTOMY  1982     Current Meds  Medication Sig  . amLODipine (NORVASC) 10 MG tablet Take 10 mg by mouth daily.   Marland Kitchen aspirin EC 81 MG tablet Take 81 mg by mouth daily.  . calcium carbonate (OS-CAL - DOSED IN MG OF ELEMENTAL CALCIUM) 1250 (500 Ca) MG tablet Take 1 tablet by mouth daily.  . calcium carbonate (TUMS - DOSED IN MG ELEMENTAL CALCIUM) 500 MG chewable tablet Chew 2 tablets by mouth 2 (two) times daily as needed for indigestion or heartburn.  . Calcium Citrate-Vitamin D (CALCIUM  CITRATE+D3 PETITES PO) Take by mouth 2 (two) times daily.  . cholecalciferol (VITAMIN D) 1000 units tablet Take 1,000 Units by mouth daily.  . clotrimazole-betamethasone (LOTRISONE) cream Apply 1 application topically 2 (two) times daily.  . diazepam (VALIUM) 10 MG tablet Take 10 mg by mouth every 6 (six) hours as needed for anxiety.  . DOCOSAHEXAENOIC ACID PO Take by mouth.  . docusate sodium (COLACE) 100 MG capsule Take 200 mg by mouth at bedtime.  Marland Kitchen doxycycline (PERIOSTAT) 20 MG tablet Take 20 mg by mouth 2 (two) times daily.  . enalapril (VASOTEC) 20 MG tablet Take 20 mg by mouth daily.  . fluticasone (FLONASE) 50 MCG/ACT nasal spray Place 1 spray into both nostrils daily.  . furosemide (LASIX) 20 MG tablet Take 20 mg by mouth daily as needed. fluid  . HYDROcodone-acetaminophen (NORCO/VICODIN) 5-325 MG tablet Take 1 tablet by mouth 2 (two) times daily.   Marland Kitchen loratadine (CLARITIN) 10 MG tablet Take 10 mg by mouth daily. Only takes in Summer  . meclizine (ANTIVERT) 12.5  MG tablet Take 12.5 mg by mouth 3 (three) times daily as needed for dizziness.  . metoprolol succinate (TOPROL-XL) 25 MG 24 hr tablet Take 25 mg by mouth daily.  . metroNIDAZOLE (METROGEL) 0.75 % gel Apply 1 application topically 2 (two) times daily.  . montelukast (SINGULAIR) 10 MG tablet Take 10 mg by mouth at bedtime.  . Multiple Vitamins-Minerals (MULTIVITAMIN WITH MINERALS) tablet Take 1 tablet by mouth daily.   Marland Kitchen neomycin-polymyxin-gramicidin (NEOSPORIN) 1.75-10000-.025 ophthalmic solution Place 1 drop into both eyes 4 (four) times daily.  . nitroGLYCERIN (NITROSTAT) 0.4 MG SL tablet Place 1 tablet (0.4 mg total) under the tongue every 5 (five) minutes as needed for chest pain (up to 3 doses).  . Omega-3 Krill Oil 500 MG CAPS Take by mouth daily.  . polyvinyl alcohol (LIQUIFILM TEARS) 1.4 % ophthalmic solution Place 2 drops into both eyes as needed for dry eyes.  . potassium chloride (K-DUR) 10 MEQ tablet Take 10 mEq by  mouth daily as needed. Take 1 capsule by mouth daily with the use of lasix as directed  . tiZANidine (ZANAFLEX) 4 MG tablet Take 4 mg by mouth every 8 (eight) hours as needed for muscle spasms.      Family History: The patient's family history is not on file.   ROS:   Please see the history of present illness.     All other systems reviewed and are negative.   Labs/Other Tests and Data Reviewed:     Recent Labs: 07/01/2018: ALT 40  Recent Lipid Panel    Component Value Date/Time   CHOL 134 06/22/2017 1522   TRIG 76 06/22/2017 1522   HDL 48 06/22/2017 1522   CHOLHDL 2.8 06/22/2017 1522   CHOLHDL 4.9 05/14/2017 0247   VLDL 18 05/14/2017 0247   LDLCALC 71 06/22/2017 1522      Exam:    Vital Signs:  BP 130/80   Pulse 68   Wt 180 lb (81.6 kg)   BMI 31.89 kg/m     Wt Readings from Last 3 Encounters:  07/20/18 180 lb (81.6 kg)  06/14/18 183 lb 12.8 oz (83.4 kg)  10/21/17 179 lb 9.6 oz (81.5 kg)     Well nourished, well developed female in no acute distress. Alert awake oriented x3.  Quite cheerful conversation.  Diagnosis for this visit:   1. CAD in native artery   2. High cholesterol   3. Essential hypertension   4. Elevated transaminase level      ASSESSMENT & PLAN:    1.  Coronary artery disease asymptomatic we will continue present management. 2.  Dyslipidemia: Off statin I will asked her to have liver function test rechecked in about a month and if it is normalized we will put her back probably on smaller dose of statin with very careful monitoring of her liver function test. 3.  Essential hypertension seems to be well controlled. 4.  Transaminase level high but improving significantly so far we stop statin and Brilinta as well as tumeric.  Plan as outlined above  COVID-19 Education: The signs and symptoms of COVID-19 were discussed with the patient and how to seek care for testing (follow up with PCP or arrange E-visit).  The importance of social  distancing was discussed today.  Patient Risk:   After full review of this patients clinical status, I feel that they are at least moderate risk at this time.  Time:   Today, I have spent 14 minutes with the patient with telehealth  technology discussing pt health issues. Visit was finished at 3:13 PM.    Medication Adjustments/Labs and Tests Ordered: Current medicines are reviewed at length with the patient today.  Concerns regarding medicines are outlined above.  Orders Placed This Encounter  Procedures  . Hepatic function panel   Medication changes: No orders of the defined types were placed in this encounter.    Disposition:  1 month   Signed, Park Liter, MD, [facility] 07/20/2018 3:10 PM    [place]

## 2018-08-15 DIAGNOSIS — M159 Polyosteoarthritis, unspecified: Secondary | ICD-10-CM | POA: Diagnosis not present

## 2018-08-15 DIAGNOSIS — R748 Abnormal levels of other serum enzymes: Secondary | ICD-10-CM | POA: Diagnosis not present

## 2018-08-15 DIAGNOSIS — I10 Essential (primary) hypertension: Secondary | ICD-10-CM | POA: Diagnosis not present

## 2018-08-15 DIAGNOSIS — E782 Mixed hyperlipidemia: Secondary | ICD-10-CM | POA: Diagnosis not present

## 2018-08-15 DIAGNOSIS — I2510 Atherosclerotic heart disease of native coronary artery without angina pectoris: Secondary | ICD-10-CM | POA: Diagnosis not present

## 2018-08-15 DIAGNOSIS — Z8673 Personal history of transient ischemic attack (TIA), and cerebral infarction without residual deficits: Secondary | ICD-10-CM | POA: Diagnosis not present

## 2018-08-17 DIAGNOSIS — E7800 Pure hypercholesterolemia, unspecified: Secondary | ICD-10-CM | POA: Diagnosis not present

## 2018-08-18 LAB — HEPATIC FUNCTION PANEL
ALT: 41 IU/L — ABNORMAL HIGH (ref 0–32)
AST: 39 IU/L (ref 0–40)
Albumin: 4.4 g/dL (ref 3.7–4.7)
Alkaline Phosphatase: 180 IU/L — ABNORMAL HIGH (ref 39–117)
Bilirubin Total: 0.4 mg/dL (ref 0.0–1.2)
Bilirubin, Direct: 0.15 mg/dL (ref 0.00–0.40)
Total Protein: 6.8 g/dL (ref 6.0–8.5)

## 2018-08-24 ENCOUNTER — Encounter: Payer: Self-pay | Admitting: Cardiology

## 2018-08-24 ENCOUNTER — Telehealth (INDEPENDENT_AMBULATORY_CARE_PROVIDER_SITE_OTHER): Payer: PPO | Admitting: Cardiology

## 2018-08-24 ENCOUNTER — Other Ambulatory Visit: Payer: Self-pay

## 2018-08-24 VITALS — BP 130/80 | HR 73 | Wt 180.0 lb

## 2018-08-24 DIAGNOSIS — R7401 Elevation of levels of liver transaminase levels: Secondary | ICD-10-CM

## 2018-08-24 DIAGNOSIS — F4329 Adjustment disorder with other symptoms: Secondary | ICD-10-CM

## 2018-08-24 DIAGNOSIS — I2510 Atherosclerotic heart disease of native coronary artery without angina pectoris: Secondary | ICD-10-CM

## 2018-08-24 DIAGNOSIS — I10 Essential (primary) hypertension: Secondary | ICD-10-CM

## 2018-08-24 MED ORDER — EZETIMIBE 10 MG PO TABS: 10.0000 mg | ORAL_TABLET | Freq: Every day | ORAL | 1 refills | Status: DC

## 2018-08-24 NOTE — Progress Notes (Signed)
Virtual Visit via Telephone Note   This visit type was conducted due to national recommendations for restrictions regarding the COVID-19 Pandemic (e.g. social distancing) in an effort to limit this patient's exposure and mitigate transmission in our community.  Due to her co-morbid illnesses, this patient is at least at moderate risk for complications without adequate follow up.  This format is felt to be most appropriate for this patient at this time.  The patient did not have access to video technology/had technical difficulties with video requiring transitioning to audio format only (telephone).  All issues noted in this document were discussed and addressed.  No physical exam could be performed with this format.  Please refer to the patient's chart for her  consent to telehealth for [facility].  Evaluation Performed:  Follow-up visit  This visit type was conducted due to national recommendations for restrictions regarding the COVID-19 Pandemic (e.g. social distancing).  This format is felt to be most appropriate for this patient at this time.  All issues noted in this document were discussed and addressed.  No physical exam was performed (except for noted visual exam findings with Video Visits).  Please refer to the patient's chart (MyChart message for video visits and phone note for telephone visits) for the patient's consent to telehealth for [facility].  Date:  07/09/9240  ID: Theresa Prince, DOB [birth year], MRN [id number]   Patient Location: [address] [place] [address]   Provider location:   [place] Office  PCP:  Manfred Shirts, [place]  Cardiologist:  Jenne Campus, MD     Chief Complaint: I am doing fine  History of Present Illness:    Theresa Prince is a 78 y.o. female  who presents via audio/video conferencing for a telehealth visit today.  With coronary artery disease status post PTCA and stenting done in February 2018.  She was taking statin  however started having elevation of the liver function test and statin has been discontinued the purpose of the visit was to discuss those issues.  Overall she is doing great asymptomatic no chest pain tightness squeezing pressure burning chest no palpitations no dizziness she try to walk on a regular basis she complained of having some fatigue when she does but overall doing well.   The patient does not have symptoms concerning for COVID-19 infection (fever, chills, cough, or new SHORTNESS OF BREATH).    Prior CV studies:   The following studies were reviewed today:       Past Medical History:  Diagnosis Date  . Acute eczematoid otitis externa of both ears 06/30/2016  . Alopecia 01/27/2014  . Anxiety   . Arthritis    "all over" (05/13/2017)  . Bulging lumbar disc 01/27/2014  . CAD in native artery    a. Cardiac cath 05/14/17 showed multivessel disease with 25% Prox-distal RCA, 75% rPDA, 95% OM2 (treated with PTCA), 90% prox-mid Cx (treated with DES), 25% prox LAD< 80% mLAD (treated with DES), 50% mid-distal LAD, 80% distal LAD, LVEDP normal, LVEF 55-65%.  . Cancer ([place])    cervical (pre-cancerous)  . 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 Chronic lower back pain   . Clumsiness 02/27/2016  . Degenerative arthritis 01/27/2014  . Depression   . Diverticulosis   . Generalized osteoarthritis of multiple sites 07/17/2014  . High cholesterol   . History of stroke without residual deficits 07/17/2014  . Hypertension   . Ingrown  nail 03/17/2017  . Lichen sclerosus 16/01/9603  . Mixed emotional features as adjustment reaction 01/27/2014  . Nasal congestion 06/30/2016  . Nasal vestibulitis 06/30/2016  . Neuralgia and neuritis 01/27/2014   Overview:  Overview:  Right anterior thigh Overview:  Right anterior thigh  . NSTEMI (non-ST elevated myocardial infarction) ([place]) 05/13/2017  . Osteopenia 01/27/2014  . PAD (peripheral artery  disease) ([place])    a. CT 04/2017 showed peripheral calcified and noncalcified atherosclerotic plaque with atherosclerotic narrowing of the splenic artery and atherosclerotic narrowing  . Pain in toes of both feet 03/17/2017  . Perennial allergic rhinitis with seasonal variation 07/17/2014  . Perioral dermatitis 12/02/2016  . PFO (patent foramen ovale)    a. noted on echo 04/2017.  Marland Kitchen Pneumonia    "twice" (05/13/2017)  . Postlaminectomy syndrome, lumbar 01/27/2014  . Primary osteoarthritis of right knee 01/27/2014  . Slow transit constipation 07/17/2014  . Stroke [facility]) 2014   "speech issues; mostly resolved; still have trouble pronouncing some words" (05/13/2017)  . Trochanteric bursitis of both hips 07/17/2014  . Varicose vein of leg   . Varicose veins 01/27/2014  . 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 Vitamin D deficiency 01/27/2014    Past Surgical History:  Procedure Laterality Date  . BACK SURGERY    . CORONARY BALLOON ANGIOPLASTY N/A 05/14/2017   Procedure: CORONARY BALLOON ANGIOPLASTY;  Surgeon: Jettie Booze, MD;  Location: [place] [facility];  Service: Cardiovascular;  Laterality: N/A;  OM2  . CORONARY STENT INTERVENTION N/A 05/14/2017   Procedure: CORONARY STENT INTERVENTION;  Surgeon: Jettie Booze, MD;  Location: [place] [facility];  Service: Cardiovascular;  Laterality: N/A;  . CYST EXCISION  1999   "took off my spine"  . LEFT HEART CATH AND CORONARY ANGIOGRAPHY N/A 05/14/2017   Procedure: LEFT HEART CATH AND CORONARY ANGIOGRAPHY;  Surgeon: Jettie Booze, MD;  Location: [place] [facility];  Service: Cardiovascular;  Laterality: N/A;  . VAGINAL HYSTERECTOMY  1982     Current Meds  Medication Sig  . acetaminophen (TYLENOL) 650 MG CR tablet Take 650 mg by mouth every 8 (eight) hours as needed for pain.  Marland Kitchen amLODipine (NORVASC) 10 MG tablet Take 10 mg by mouth daily.   Marland Kitchen aspirin EC 81 MG tablet Take 81 mg by mouth daily.  . calcium carbonate  (OS-CAL - DOSED IN MG OF ELEMENTAL CALCIUM) 1250 (500 Ca) MG tablet Take 1 tablet by mouth daily.  . calcium carbonate (TUMS - DOSED IN MG ELEMENTAL CALCIUM) 500 MG chewable tablet Chew 2 tablets by mouth 2 (two) times daily as needed for indigestion or heartburn.  . Calcium Citrate-Vitamin D (CALCIUM CITRATE+D3 PETITES PO) Take by mouth 2 (two) times daily.  . cholecalciferol (VITAMIN D) 1000 units tablet Take 1,000 Units by mouth daily.  . clotrimazole-betamethasone (LOTRISONE) cream Apply 1 application topically 2 (two) times daily.  . diazepam (VALIUM) 10 MG tablet Take 10 mg by mouth every 6 (six) hours as needed for anxiety.  . DOCOSAHEXAENOIC ACID PO Take by mouth.  . docusate sodium (COLACE) 100 MG capsule Take 200 mg by mouth at bedtime.  Marland Kitchen doxycycline (PERIOSTAT) 20 MG tablet Take 20 mg by mouth 2 (two) times daily.  . enalapril (VASOTEC) 20 MG tablet Take 20 mg by mouth daily.  . fluticasone (FLONASE) 50 MCG/ACT nasal spray Place 1 spray into both nostrils daily.  . furosemide (LASIX) 20 MG tablet Take 20 mg by mouth daily as needed. fluid  .  HYDROcodone-acetaminophen (NORCO/VICODIN) 5-325 MG tablet Take 1 tablet by mouth 2 (two) times daily.   Marland Kitchen loratadine (CLARITIN) 10 MG tablet Take 10 mg by mouth daily. Only takes in Summer  . meclizine (ANTIVERT) 12.5 MG tablet Take 12.5 mg by mouth 3 (three) times daily as needed for dizziness.  . metoprolol succinate (TOPROL-XL) 25 MG 24 hr tablet Take 25 mg by mouth daily.  . metroNIDAZOLE (METROGEL) 0.75 % gel Apply 1 application topically 2 (two) times daily.  . montelukast (SINGULAIR) 10 MG tablet Take 10 mg by mouth at bedtime.  . Multiple Vitamins-Minerals (MULTIVITAMIN WITH MINERALS) tablet Take 1 tablet by mouth daily.   Marland Kitchen neomycin-polymyxin-gramicidin (NEOSPORIN) 1.75-10000-.025 ophthalmic solution Place 1 drop into both eyes 4 (four) times daily.  . nitroGLYCERIN (NITROSTAT) 0.4 MG SL tablet Place 1 tablet (0.4 mg total) under the  tongue every 5 (five) minutes as needed for chest pain (up to 3 doses).  . Omega-3 Krill Oil 500 MG CAPS Take by mouth daily.  . polyvinyl alcohol (LIQUIFILM TEARS) 1.4 % ophthalmic solution Place 2 drops into both eyes as needed for dry eyes.  . potassium chloride (K-DUR) 10 MEQ tablet Take 10 mEq by mouth daily as needed. Take 1 capsule by mouth daily with the use of lasix as directed  . tiZANidine (ZANAFLEX) 4 MG tablet Take 4 mg by mouth every 8 (eight) hours as needed for muscle spasms.      Family History: The patient's family history is not on file.   ROS:   Please see the history of present illness.     All other systems reviewed and are negative.   Labs/Other Tests and Data Reviewed:     Recent Labs: 08/17/2018: ALT 41  Recent Lipid Panel    Component Value Date/Time   CHOL 134 06/22/2017 1522   TRIG 76 06/22/2017 1522   HDL 48 06/22/2017 1522   CHOLHDL 2.8 06/22/2017 1522   CHOLHDL 4.9 05/14/2017 0247   VLDL 18 05/14/2017 0247   LDLCALC 71 06/22/2017 1522      Exam:    Vital Signs:  BP 130/80   Pulse 73   Wt 180 lb (81.6 kg)   BMI 31.89 kg/m     Wt Readings from Last 3 Encounters:  08/24/18 180 lb (81.6 kg)  07/20/18 180 lb (81.6 kg)  06/14/18 183 lb 12.8 oz (83.4 kg)     Well nourished, well developed in no acute distress. Alert awake oriented x3.  She has no technical ability to establish video link therefore we doing phone call only.  Not in any distress  Diagnosis for this visit:   1. CAD in native artery   2. Mixed emotional features as adjustment reaction   3. Elevated transaminase level   4. Essential hypertension      ASSESSMENT & PLAN:    1.  Coronary disease stable asymptomatic on appropriate medication that I will continue. Elevation transaminase level.  Better but still elevated.  I offer her visit with GI specialist she said she would like to think about this more in the matter-of-fact she told me the next time she would like to  see me in person so she can discuss this in more details I told her that is perfectly fine with me and will make adjustment for that. 3.  Essential hypertension blood pressure well controlled. 4.  Peripheral vascular disease.  Stable 5.  Dyslipidemia I put her on Zetia 29-month from now will check liver function tests  as well as cholesterol.  COVID-19 Education: The signs and symptoms of COVID-19 were discussed with the patient and how to seek care for testing (follow up with PCP or arrange E-visit).  The importance of social distancing was discussed today.  Patient Risk:   After full review of this patients clinical status, I feel that they are at least moderate risk at this time.  Time:   Today, I have spent 18 minutes with the patient with telehealth technology discussing pt health issues.  I spent 5 minutes reviewing her chart before the visit.  Visit was finished at 2:24 PM.    Medication Adjustments/Labs and Tests Ordered: Current medicines are reviewed at length with the patient today.  Concerns regarding medicines are outlined above.  No orders of the defined types were placed in this encounter.  Medication changes: No orders of the defined types were placed in this encounter.    Disposition: Follow-up in 1 month  Signed, Park Liter, MD, [facility] 08/24/2018 2:26 PM    [place]

## 2018-08-24 NOTE — Addendum Note (Signed)
Addended by: Ashok Norris on: 08/24/2018 02:49 PM   Modules accepted: Orders

## 2018-08-24 NOTE — Patient Instructions (Signed)
Medication Instructions:  Your physician has recommended you make the following change in your medication:  Start: Zetia 10 mg daily   If you need a refill on your cardiac medications before your next appointment, please call your pharmacy.   Lab work: None.  If you have labs (blood work) drawn today and your tests are completely normal, you will receive your results only by: Marland Kitchen MyChart Message (if you have MyChart) OR . A paper copy in the mail If you have any lab test that is abnormal or we need to change your treatment, we will call you to review the results.  Testing/Procedures: None.   Follow-Up: At [facility], you and your health needs are our priority.  As part of our continuing mission to provide you with exceptional heart care, we have created designated Provider Care Teams.  These Care Teams include your primary Cardiologist (physician) and Advanced Practice Providers (APPs -  Physician Assistants and Nurse Practitioners) who all work together to provide you with the care you need, when you need it. . You will need a follow up appointment in 1 months.    Any Other Special Instructions Will Be Listed Below (If Applicable).  Ezetimibe Tablets What is this medicine? EZETIMIBE (ez ET i mibe) blocks the absorption of cholesterol from the stomach. It can help lower blood cholesterol for patients who are at risk of getting heart disease or a stroke. It is only for patients whose cholesterol level is not controlled by diet. This medicine may be used for other purposes; ask your health care provider or pharmacist if you have questions. COMMON BRAND NAME(S): Zetia What should I tell my health care provider before I take this medicine? They need to know if you have any of these conditions: -liver disease -an unusual or allergic reaction to ezetimibe, medicines, foods, dyes, or preservatives -pregnant or trying to get pregnant -breast-feeding How should I use this medicine? Take  this medicine by mouth with a glass of water. Follow the directions on the prescription label. This medicine can be taken with or without food. Take your doses at regular intervals. Do not take your medicine more often than directed. Talk to your pediatrician regarding the use of this medicine in children. Special care may be needed. Overdosage: If you think you have taken too much of this medicine contact a poison control center or emergency room at once. NOTE: This medicine is only for you. Do not share this medicine with others. What if I miss a dose? If you miss a dose, take it as soon as you can. If it is almost time for your next dose, take only that dose. Do not take double or extra doses. What may interact with this medicine? Do not take this medicine with any of the following medications: -fenofibrate -gemfibrozil This medicine may also interact with the following medications: -antacids -cyclosporine -herbal medicines like red yeast rice -other medicines to lower cholesterol or triglycerides This list may not describe all possible interactions. Give your health care provider a list of all the medicines, herbs, non-prescription drugs, or dietary supplements you use. Also tell them if you smoke, drink alcohol, or use illegal drugs. Some items may interact with your medicine. What should I watch for while using this medicine? Visit your doctor or health care professional for regular checks on your progress. You will need to have your cholesterol levels checked. If you are also taking some other cholesterol medicines, you will also need to have tests  to make sure your liver is working properly. Tell your doctor or health care professional if you get any unexplained muscle pain, tenderness, or weakness, especially if you also have a fever and tiredness. You need to follow a low-cholesterol, low-fat diet while you are taking this medicine. This will decrease your risk of getting heart and blood  vessel disease. Exercising and avoiding alcohol and smoking can also help. Ask your doctor or dietician for advice. What side effects may I notice from receiving this medicine? Side effects that you should report to your doctor or health care professional as soon as possible: -allergic reactions like skin rash, itching or hives, swelling of the face, lips, or tongue -dark yellow or brown urine -unusually weak or tired -yellowing of the skin or eyes Side effects that usually do not require medical attention (report to your doctor or health care professional if they continue or are bothersome): -diarrhea -dizziness -headache -stomach upset or pain This list may not describe all possible side effects. Call your doctor for medical advice about side effects. You may report side effects to FDA at 1-800-FDA-1088. Where should I keep my medicine? Keep out of the reach of children. Store at room temperature between 15 and 30 degrees C (59 and 86 degrees F). Protect from moisture. Keep container tightly closed. Throw away any unused medicine after the expiration date. NOTE: This sheet is a summary. It may not cover all possible information. If you have questions about this medicine, talk to your doctor, pharmacist, or health care provider.  2019 Elsevier/Gold Standard (2011-10-05 15:39:09)

## 2018-08-25 ENCOUNTER — Telehealth: Payer: PPO | Admitting: Cardiology

## 2018-08-26 ENCOUNTER — Telehealth: Payer: PPO | Admitting: Cardiology

## 2018-09-23 ENCOUNTER — Ambulatory Visit: Payer: PPO | Admitting: Cardiology

## 2018-09-26 ENCOUNTER — Ambulatory Visit: Payer: PPO | Admitting: Cardiology

## 2018-11-08 ENCOUNTER — Ambulatory Visit: Payer: PPO | Admitting: Cardiology

## 2018-11-18 ENCOUNTER — Other Ambulatory Visit: Payer: Self-pay

## 2018-11-18 ENCOUNTER — Encounter: Payer: Self-pay | Admitting: Cardiology

## 2018-11-18 ENCOUNTER — Telehealth (INDEPENDENT_AMBULATORY_CARE_PROVIDER_SITE_OTHER): Payer: PPO | Admitting: Cardiology

## 2018-11-18 VITALS — BP 130/80 | HR 68 | Wt 182.0 lb

## 2018-11-18 DIAGNOSIS — I2510 Atherosclerotic heart disease of native coronary artery without angina pectoris: Secondary | ICD-10-CM

## 2018-11-18 DIAGNOSIS — Z8673 Personal history of transient ischemic attack (TIA), and cerebral infarction without residual deficits: Secondary | ICD-10-CM

## 2018-11-18 NOTE — Progress Notes (Signed)
Virtual Visit via Telephone Note   This visit type was conducted due to national recommendations for restrictions regarding the COVID-19 Pandemic (e.g. social distancing) in an effort to limit this patient's exposure and mitigate transmission in our community.  Due to her co-morbid illnesses, this patient is at least at moderate risk for complications without adequate follow up.  This format is felt to be most appropriate for this patient at this time.  The patient did not have access to video technology/had technical difficulties with video requiring transitioning to audio format only (telephone).  All issues noted in this document were discussed and addressed.  No physical exam could be performed with this format.  Please refer to the patient's chart for her  consent to telehealth for [facility].  Evaluation Performed:  Follow-up visit  This visit type was conducted due to national recommendations for restrictions regarding the COVID-19 Pandemic (e.g. social distancing).  This format is felt to be most appropriate for this patient at this time.  All issues noted in this document were discussed and addressed.  No physical exam was performed (except for noted visual exam findings with Video Visits).  Please refer to the patient's chart (MyChart message for video visits and phone note for telephone visits) for the patient's consent to telehealth for [facility].  Date:  05/22/7679  ID: [name], DOB [birth year], MRN [id number]   Patient Location: [address] [place] [address]   Provider location:   [place] Office  PCP:  Manfred Shirts, [place]  Cardiologist:  Jenne Campus, MD     Chief Complaint: Doing well  History of Present Illness:    Theresa Prince is a 78 y.o. female  who presents via audio/video conferencing for a telehealth visit today.  With coronary artery disease status post cardiac catheterization 2019 February showing 75% right PDA 95% obtuse  marginal branch that was treated with PTCA also 90% proximal mid circumflex treated with drug-eluting stent.  She also got 25% proximal LAD and less than 80% need to LAD treated with drug-eluting stent.  Also got 80% distal LAD.  Ejection fraction was normal at that time.  On talking to her over the phone today overall she is doing well denies having any chest pain tightness squeezing pressure burning chest.   The patient does not have symptoms concerning for COVID-19 infection (fever, chills, cough, or new SHORTNESS OF BREATH).    Prior CV studies:   The following studies were reviewed today:       Past Medical History:  Diagnosis Date  . Acute eczematoid otitis externa of both ears 06/30/2016  . Alopecia 01/27/2014  . Anxiety   . Arthritis    "all over" (05/13/2017)  . Bulging lumbar disc 01/27/2014  . CAD in native artery    a. Cardiac cath 05/14/17 showed multivessel disease with 25% Prox-distal RCA, 75% rPDA, 95% OM2 (treated with PTCA), 90% prox-mid Cx (treated with DES), 25% prox LAD< 80% mLAD (treated with DES), 50% mid-distal LAD, 80% distal LAD, LVEDP normal, LVEF 55-65%.  . Cancer ([place])    cervical (pre-cancerous)  . 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 Chronic lower back pain   . Clumsiness 02/27/2016  . Degenerative arthritis 01/27/2014  . Depression   . Diverticulosis   . Generalized osteoarthritis of multiple sites 07/17/2014  . High cholesterol   . History of stroke without residual deficits 07/17/2014  . Hypertension   .  Ingrown nail 03/17/2017  . Lichen sclerosus 67/03/4579  . Mixed emotional features as adjustment reaction 01/27/2014  . Nasal congestion 06/30/2016  . Nasal vestibulitis 06/30/2016  . Neuralgia and neuritis 01/27/2014   Overview:  Overview:  Right anterior thigh Overview:  Right anterior thigh  . NSTEMI (non-ST elevated myocardial infarction) ([place]) 05/13/2017  . Osteopenia 01/27/2014  .  PAD (peripheral artery disease) ([place])    a. CT 04/2017 showed peripheral calcified and noncalcified atherosclerotic plaque with atherosclerotic narrowing of the splenic artery and atherosclerotic narrowing  . Pain in toes of both feet 03/17/2017  . Perennial allergic rhinitis with seasonal variation 07/17/2014  . Perioral dermatitis 12/02/2016  . PFO (patent foramen ovale)    a. noted on echo 04/2017.  Marland Kitchen Pneumonia    "twice" (05/13/2017)  . Postlaminectomy syndrome, lumbar 01/27/2014  . Primary osteoarthritis of right knee 01/27/2014  . Slow transit constipation 07/17/2014  . Stroke [facility]) 2014   "speech issues; mostly resolved; still have trouble pronouncing some words" (05/13/2017)  . Trochanteric bursitis of both hips 07/17/2014  . Varicose vein of leg   . Varicose veins 01/27/2014  . 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 Vitamin D deficiency 01/27/2014    Past Surgical History:  Procedure Laterality Date  . BACK SURGERY    . CORONARY BALLOON ANGIOPLASTY N/A 05/14/2017   Procedure: CORONARY BALLOON ANGIOPLASTY;  Surgeon: Jettie Booze, MD;  Location: [place] [facility];  Service: Cardiovascular;  Laterality: N/A;  OM2  . CORONARY STENT INTERVENTION N/A 05/14/2017   Procedure: CORONARY STENT INTERVENTION;  Surgeon: Jettie Booze, MD;  Location: [place] [facility];  Service: Cardiovascular;  Laterality: N/A;  . CYST EXCISION  1999   "took off my spine"  . LEFT HEART CATH AND CORONARY ANGIOGRAPHY N/A 05/14/2017   Procedure: LEFT HEART CATH AND CORONARY ANGIOGRAPHY;  Surgeon: Jettie Booze, MD;  Location: [place] [facility];  Service: Cardiovascular;  Laterality: N/A;  . VAGINAL HYSTERECTOMY  1982     Current Meds  Medication Sig  . acetaminophen (TYLENOL) 650 MG CR tablet Take 650 mg by mouth every 8 (eight) hours as needed for pain.  Marland Kitchen amLODipine (NORVASC) 10 MG tablet Take 10 mg by mouth daily.   Marland Kitchen aspirin EC 81 MG tablet Take 81 mg by mouth daily.   . calcium carbonate (OS-CAL - DOSED IN MG OF ELEMENTAL CALCIUM) 1250 (500 Ca) MG tablet Take 1 tablet by mouth daily.  . calcium carbonate (TUMS - DOSED IN MG ELEMENTAL CALCIUM) 500 MG chewable tablet Chew 2 tablets by mouth 2 (two) times daily as needed for indigestion or heartburn.  . Calcium Citrate-Vitamin D (CALCIUM CITRATE+D3 PETITES PO) Take by mouth 2 (two) times daily.  . cholecalciferol (VITAMIN D) 1000 units tablet Take 1,000 Units by mouth daily.  . clotrimazole-betamethasone (LOTRISONE) cream Apply 1 application topically 2 (two) times daily.  . diazepam (VALIUM) 10 MG tablet Take 10 mg by mouth every 6 (six) hours as needed for anxiety.  . DOCOSAHEXAENOIC ACID PO Take by mouth.  . docusate sodium (COLACE) 100 MG capsule Take 200 mg by mouth at bedtime.  Marland Kitchen doxycycline (PERIOSTAT) 20 MG tablet Take 20 mg by mouth 2 (two) times daily.  . enalapril (VASOTEC) 20 MG tablet Take 20 mg by mouth daily.  . fluticasone (FLONASE) 50 MCG/ACT nasal spray Place 1 spray into both nostrils daily.  . furosemide (LASIX) 20 MG tablet Take 20 mg by mouth daily as needed.  fluid  . HYDROcodone-acetaminophen (NORCO/VICODIN) 5-325 MG tablet Take 1 tablet by mouth 2 (two) times daily.   Marland Kitchen loratadine (CLARITIN) 10 MG tablet Take 10 mg by mouth daily. Only takes in Summer  . meclizine (ANTIVERT) 12.5 MG tablet Take 12.5 mg by mouth 3 (three) times daily as needed for dizziness.  . metoprolol succinate (TOPROL-XL) 25 MG 24 hr tablet Take 25 mg by mouth daily.  . metroNIDAZOLE (METROGEL) 0.75 % gel Apply 1 application topically 2 (two) times daily.  . montelukast (SINGULAIR) 10 MG tablet Take 10 mg by mouth at bedtime.  . Multiple Vitamins-Minerals (MULTIVITAMIN WITH MINERALS) tablet Take 1 tablet by mouth daily.   Marland Kitchen neomycin-polymyxin-gramicidin (NEOSPORIN) 1.75-10000-.025 ophthalmic solution Place 1 drop into both eyes 4 (four) times daily.  . nitroGLYCERIN (NITROSTAT) 0.4 MG SL tablet Place 1 tablet (0.4 mg  total) under the tongue every 5 (five) minutes as needed for chest pain (up to 3 doses).  . Omega-3 Krill Oil 500 MG CAPS Take by mouth daily.  . polyvinyl alcohol (LIQUIFILM TEARS) 1.4 % ophthalmic solution Place 2 drops into both eyes as needed for dry eyes.  . potassium chloride (K-DUR) 10 MEQ tablet Take 10 mEq by mouth daily as needed. Take 1 capsule by mouth daily with the use of lasix as directed  . tiZANidine (ZANAFLEX) 4 MG tablet Take 4 mg by mouth every 8 (eight) hours as needed for muscle spasms.      Family History: The patient's family history is not on file.   ROS:   Please see the history of present illness.     All other systems reviewed and are negative.   Labs/Other Tests and Data Reviewed:     Recent Labs: 08/17/2018: ALT 41  Recent Lipid Panel    Component Value Date/Time   CHOL 134 06/22/2017 1522   TRIG 76 06/22/2017 1522   HDL 48 06/22/2017 1522   CHOLHDL 2.8 06/22/2017 1522   CHOLHDL 4.9 05/14/2017 0247   VLDL 18 05/14/2017 0247   LDLCALC 71 06/22/2017 1522      Exam:    Vital Signs:  BP 130/80   Pulse 68   Wt 182 lb (82.6 kg)   BMI 32.24 kg/m     Wt Readings from Last 3 Encounters:  11/18/18 182 lb (82.6 kg)  08/24/18 180 lb (81.6 kg)  07/20/18 180 lb (81.6 kg)     Well nourished, well developed in no acute distress. Alert awake and x3 not in distress with talking over the phone.  Denies having a chest pain  Diagnosis for this visit:   1. CAD in native artery   2. History of stroke without residual deficits      ASSESSMENT & PLAN:    1.  Coronary disease stable on appropriate medications. Dyslipidemia big problem she had difficulty tolerating medication I try to put her on Zetia she was unable to tolerate this I initiated conversation about potentially PCSK9 agent she does not want to do it.  The issue also some liver function test elevation she is getting ready to see her primary care physician who will do her liver profile I  requested copy of it so can make a decision about treatment.  COVID-19 Education: The signs and symptoms of COVID-19 were discussed with the patient and how to seek care for testing (follow up with PCP or arrange E-visit).  The importance of social distancing was discussed today.  Patient Risk:   After full review of this  patients clinical status, I feel that they are at least moderate risk at this time.  Time:   Today, I have spent 15 minutes with the patient with telehealth technology discussing pt health issues.  I spent 5 minutes reviewing her chart before the visit.  Visit was finished at 4:45 PM.    Medication Adjustments/Labs and Tests Ordered: Current medicines are reviewed at length with the patient today.  Concerns regarding medicines are outlined above.  No orders of the defined types were placed in this encounter.  Medication changes: No orders of the defined types were placed in this encounter.    Disposition: Follow-up 3 months  Signed, Park Liter, MD, [facility] 11/18/2018 4:46 PM    [place]

## 2018-11-18 NOTE — Patient Instructions (Signed)
Medication Instructions:  Your physician recommends that you continue on your current medications as directed. Please refer to the Current Medication list given to you today.  If you need a refill on your cardiac medications before your next appointment, please call your pharmacy.   Lab work: None.  If you have labs (blood work) drawn today and your tests are completely normal, you will receive your results only by: . MyChart Message (if you have MyChart) OR . A paper copy in the mail If you have any lab test that is abnormal or we need to change your treatment, we will call you to review the results.  Testing/Procedures: None.   Follow-Up: At [facility], you and your health needs are our priority.  As part of our continuing mission to provide you with exceptional heart care, we have created designated Provider Care Teams.  These Care Teams include your primary Cardiologist (physician) and Advanced Practice Providers (APPs -  Physician Assistants and Nurse Practitioners) who all work together to provide you with the care you need, when you need it. You will need a follow up appointment in 3 months.  Please call our office 2 months in advance to schedule this appointment.  You may see No primary care provider on file. or another member of our [facility] Provider Team in [place]: Brian Munley, MD . Rajan Revankar, MD  Any Other Special Instructions Will Be Listed Below (If Applicable).     

## 2018-11-28 DIAGNOSIS — E559 Vitamin D deficiency, unspecified: Secondary | ICD-10-CM | POA: Diagnosis not present

## 2018-11-28 DIAGNOSIS — I2510 Atherosclerotic heart disease of native coronary artery without angina pectoris: Secondary | ICD-10-CM | POA: Diagnosis not present

## 2018-11-28 DIAGNOSIS — R7309 Other abnormal glucose: Secondary | ICD-10-CM | POA: Diagnosis not present

## 2018-11-28 DIAGNOSIS — R748 Abnormal levels of other serum enzymes: Secondary | ICD-10-CM | POA: Diagnosis not present

## 2018-11-28 DIAGNOSIS — R7301 Impaired fasting glucose: Secondary | ICD-10-CM | POA: Diagnosis not present

## 2018-11-28 DIAGNOSIS — I10 Essential (primary) hypertension: Secondary | ICD-10-CM | POA: Diagnosis not present

## 2018-11-28 DIAGNOSIS — E782 Mixed hyperlipidemia: Secondary | ICD-10-CM | POA: Diagnosis not present

## 2018-11-28 DIAGNOSIS — M159 Polyosteoarthritis, unspecified: Secondary | ICD-10-CM | POA: Diagnosis not present

## 2018-11-29 DIAGNOSIS — L219 Seborrheic dermatitis, unspecified: Secondary | ICD-10-CM | POA: Diagnosis not present

## 2018-11-29 DIAGNOSIS — L719 Rosacea, unspecified: Secondary | ICD-10-CM | POA: Diagnosis not present

## 2018-11-29 DIAGNOSIS — L638 Other alopecia areata: Secondary | ICD-10-CM | POA: Diagnosis not present

## 2018-12-07 DIAGNOSIS — H5203 Hypermetropia, bilateral: Secondary | ICD-10-CM | POA: Diagnosis not present

## 2018-12-07 DIAGNOSIS — H52203 Unspecified astigmatism, bilateral: Secondary | ICD-10-CM | POA: Diagnosis not present

## 2018-12-07 DIAGNOSIS — H353131 Nonexudative age-related macular degeneration, bilateral, early dry stage: Secondary | ICD-10-CM | POA: Diagnosis not present

## 2018-12-07 DIAGNOSIS — H524 Presbyopia: Secondary | ICD-10-CM | POA: Diagnosis not present

## 2018-12-07 DIAGNOSIS — H35371 Puckering of macula, right eye: Secondary | ICD-10-CM | POA: Diagnosis not present

## 2018-12-07 DIAGNOSIS — H25013 Cortical age-related cataract, bilateral: Secondary | ICD-10-CM | POA: Diagnosis not present

## 2018-12-07 DIAGNOSIS — H2513 Age-related nuclear cataract, bilateral: Secondary | ICD-10-CM | POA: Diagnosis not present

## 2019-01-10 ENCOUNTER — Telehealth: Payer: Self-pay | Admitting: Cardiology

## 2019-01-10 DIAGNOSIS — L439 Lichen planus, unspecified: Secondary | ICD-10-CM | POA: Diagnosis not present

## 2019-01-10 NOTE — Telephone Encounter (Signed)
Please call patient she has a question bout the  Medicine Hydroxychloroquine 200mg  that would treat the dx: of  Lichenplanus for her.She wants to make sure it is ok>>

## 2019-01-11 NOTE — Telephone Encounter (Signed)
Should be fine.

## 2019-01-12 DIAGNOSIS — I252 Old myocardial infarction: Secondary | ICD-10-CM

## 2019-01-12 HISTORY — DX: Old myocardial infarction: I25.2

## 2019-01-12 NOTE — Telephone Encounter (Signed)
Called patient and informed her that Dr. Agustin Cree advised it would be okay for her to take hydroxychloroquine. Patient verbally understood, no further questions.

## 2019-01-13 DIAGNOSIS — L659 Nonscarring hair loss, unspecified: Secondary | ICD-10-CM | POA: Diagnosis not present

## 2019-01-13 DIAGNOSIS — I10 Essential (primary) hypertension: Secondary | ICD-10-CM | POA: Diagnosis not present

## 2019-01-13 DIAGNOSIS — D582 Other hemoglobinopathies: Secondary | ICD-10-CM | POA: Diagnosis not present

## 2019-01-13 DIAGNOSIS — Z23 Encounter for immunization: Secondary | ICD-10-CM | POA: Diagnosis not present

## 2019-01-13 DIAGNOSIS — L439 Lichen planus, unspecified: Secondary | ICD-10-CM | POA: Diagnosis not present

## 2019-03-16 ENCOUNTER — Other Ambulatory Visit: Payer: Self-pay | Admitting: Cardiology

## 2019-03-27 ENCOUNTER — Telehealth: Payer: Self-pay | Admitting: Cardiology

## 2019-03-27 NOTE — Telephone Encounter (Signed)
Called patient and informed her that a refill was sent to [facility] on 03/17/2019. She verbally understood she will check with them and let us know if for some reason they don't have it.

## 2019-03-27 NOTE — Telephone Encounter (Signed)
Please call Nitorglycerin to the [facility] ,patient has not had [place] call in before, he said he would when she needed,

## 2019-03-29 DIAGNOSIS — I2510 Atherosclerotic heart disease of native coronary artery without angina pectoris: Secondary | ICD-10-CM | POA: Diagnosis not present

## 2019-03-29 DIAGNOSIS — M159 Polyosteoarthritis, unspecified: Secondary | ICD-10-CM | POA: Diagnosis not present

## 2019-03-29 DIAGNOSIS — M1380 Other specified arthritis, unspecified site: Secondary | ICD-10-CM | POA: Diagnosis not present

## 2019-03-29 DIAGNOSIS — E782 Mixed hyperlipidemia: Secondary | ICD-10-CM | POA: Diagnosis not present

## 2019-03-29 DIAGNOSIS — I10 Essential (primary) hypertension: Secondary | ICD-10-CM | POA: Diagnosis not present

## 2019-03-29 DIAGNOSIS — I739 Peripheral vascular disease, unspecified: Secondary | ICD-10-CM | POA: Diagnosis not present

## 2019-03-29 DIAGNOSIS — R7301 Impaired fasting glucose: Secondary | ICD-10-CM | POA: Diagnosis not present

## 2019-05-25 DIAGNOSIS — M1712 Unilateral primary osteoarthritis, left knee: Secondary | ICD-10-CM | POA: Diagnosis not present

## 2019-05-25 DIAGNOSIS — M170 Bilateral primary osteoarthritis of knee: Secondary | ICD-10-CM | POA: Diagnosis not present

## 2019-05-25 DIAGNOSIS — M7122 Synovial cyst of popliteal space [Baker], left knee: Secondary | ICD-10-CM | POA: Diagnosis not present

## 2019-06-04 ENCOUNTER — Observation Stay (HOSPITAL_BASED_OUTPATIENT_CLINIC_OR_DEPARTMENT_OTHER)
Admission: EM | Admit: 2019-06-04 | Discharge: 2019-06-06 | Disposition: A | Discharge status: Home / Self Care | Payer: PPO | Attending: Family Medicine | Admitting: Family Medicine

## 2019-06-04 ENCOUNTER — Other Ambulatory Visit: Payer: Self-pay

## 2019-06-04 ENCOUNTER — Emergency Department (HOSPITAL_BASED_OUTPATIENT_CLINIC_OR_DEPARTMENT_OTHER): Payer: PPO

## 2019-06-04 ENCOUNTER — Encounter (HOSPITAL_BASED_OUTPATIENT_CLINIC_OR_DEPARTMENT_OTHER): Payer: Self-pay | Admitting: *Deleted

## 2019-06-04 DIAGNOSIS — M545 Low back pain: Secondary | ICD-10-CM | POA: Diagnosis not present

## 2019-06-04 DIAGNOSIS — I700 Atherosclerosis of aorta: Secondary | ICD-10-CM | POA: Insufficient documentation

## 2019-06-04 DIAGNOSIS — I69328 Other speech and language deficits following cerebral infarction: Secondary | ICD-10-CM | POA: Insufficient documentation

## 2019-06-04 DIAGNOSIS — Z7982 Long term (current) use of aspirin: Secondary | ICD-10-CM | POA: Insufficient documentation

## 2019-06-04 DIAGNOSIS — F329 Major depressive disorder, single episode, unspecified: Secondary | ICD-10-CM | POA: Insufficient documentation

## 2019-06-04 DIAGNOSIS — E7800 Pure hypercholesterolemia, unspecified: Secondary | ICD-10-CM | POA: Insufficient documentation

## 2019-06-04 DIAGNOSIS — Z955 Presence of coronary angioplasty implant and graft: Secondary | ICD-10-CM | POA: Insufficient documentation

## 2019-06-04 DIAGNOSIS — K922 Gastrointestinal hemorrhage, unspecified: Secondary | ICD-10-CM | POA: Diagnosis present

## 2019-06-04 DIAGNOSIS — K625 Hemorrhage of anus and rectum: Secondary | ICD-10-CM | POA: Diagnosis not present

## 2019-06-04 DIAGNOSIS — K5730 Diverticulosis of large intestine without perforation or abscess without bleeding: Secondary | ICD-10-CM | POA: Diagnosis not present

## 2019-06-04 DIAGNOSIS — K8020 Calculus of gallbladder without cholecystitis without obstruction: Secondary | ICD-10-CM | POA: Diagnosis not present

## 2019-06-04 DIAGNOSIS — I10 Essential (primary) hypertension: Secondary | ICD-10-CM | POA: Insufficient documentation

## 2019-06-04 DIAGNOSIS — M961 Postlaminectomy syndrome, not elsewhere classified: Secondary | ICD-10-CM | POA: Insufficient documentation

## 2019-06-04 DIAGNOSIS — I083 Combined rheumatic disorders of mitral, aortic and tricuspid valves: Secondary | ICD-10-CM | POA: Insufficient documentation

## 2019-06-04 DIAGNOSIS — Z791 Long term (current) use of non-steroidal anti-inflammatories (NSAID): Secondary | ICD-10-CM | POA: Insufficient documentation

## 2019-06-04 DIAGNOSIS — I739 Peripheral vascular disease, unspecified: Secondary | ICD-10-CM | POA: Insufficient documentation

## 2019-06-04 DIAGNOSIS — I252 Old myocardial infarction: Secondary | ICD-10-CM | POA: Insufficient documentation

## 2019-06-04 DIAGNOSIS — Z20822 Contact with and (suspected) exposure to covid-19: Secondary | ICD-10-CM | POA: Diagnosis not present

## 2019-06-04 DIAGNOSIS — F419 Anxiety disorder, unspecified: Secondary | ICD-10-CM | POA: Insufficient documentation

## 2019-06-04 DIAGNOSIS — M8580 Other specified disorders of bone density and structure, unspecified site: Secondary | ICD-10-CM | POA: Diagnosis not present

## 2019-06-04 DIAGNOSIS — Z888 Allergy status to other drugs, medicaments and biological substances status: Secondary | ICD-10-CM | POA: Diagnosis not present

## 2019-06-04 DIAGNOSIS — M1990 Unspecified osteoarthritis, unspecified site: Secondary | ICD-10-CM | POA: Insufficient documentation

## 2019-06-04 DIAGNOSIS — K449 Diaphragmatic hernia without obstruction or gangrene: Secondary | ICD-10-CM | POA: Insufficient documentation

## 2019-06-04 DIAGNOSIS — D120 Benign neoplasm of cecum: Secondary | ICD-10-CM | POA: Diagnosis not present

## 2019-06-04 DIAGNOSIS — K621 Rectal polyp: Secondary | ICD-10-CM | POA: Insufficient documentation

## 2019-06-04 DIAGNOSIS — D62 Acute posthemorrhagic anemia: Secondary | ICD-10-CM | POA: Diagnosis not present

## 2019-06-04 DIAGNOSIS — I2510 Atherosclerotic heart disease of native coronary artery without angina pectoris: Secondary | ICD-10-CM | POA: Diagnosis not present

## 2019-06-04 DIAGNOSIS — Q211 Atrial septal defect: Secondary | ICD-10-CM | POA: Insufficient documentation

## 2019-06-04 DIAGNOSIS — Q2112 Patent foramen ovale: Secondary | ICD-10-CM

## 2019-06-04 DIAGNOSIS — K648 Other hemorrhoids: Secondary | ICD-10-CM | POA: Diagnosis not present

## 2019-06-04 DIAGNOSIS — G8929 Other chronic pain: Secondary | ICD-10-CM | POA: Diagnosis not present

## 2019-06-04 DIAGNOSIS — Z03818 Encounter for observation for suspected exposure to other biological agents ruled out: Secondary | ICD-10-CM | POA: Diagnosis not present

## 2019-06-04 DIAGNOSIS — Z885 Allergy status to narcotic agent status: Secondary | ICD-10-CM | POA: Diagnosis not present

## 2019-06-04 DIAGNOSIS — Z90710 Acquired absence of both cervix and uterus: Secondary | ICD-10-CM | POA: Insufficient documentation

## 2019-06-04 DIAGNOSIS — Z79899 Other long term (current) drug therapy: Secondary | ICD-10-CM | POA: Insufficient documentation

## 2019-06-04 DIAGNOSIS — E876 Hypokalemia: Secondary | ICD-10-CM | POA: Insufficient documentation

## 2019-06-04 HISTORY — DX: Gastrointestinal hemorrhage, unspecified: K92.2

## 2019-06-04 LAB — CBC
HCT: 41.7 % (ref 36.0–46.0)
HCT: 41.4 % (ref 36.0–46.0)
Hemoglobin: 13.2 g/dL (ref 12.0–15.0)
Hemoglobin: 13.1 g/dL (ref 12.0–15.0)
MCH: 29 pg (ref 26.0–34.0)
MCH: 28.9 pg (ref 26.0–34.0)
MCHC: 31.7 g/dL (ref 30.0–36.0)
MCHC: 31.6 g/dL (ref 30.0–36.0)
MCV: 91.6 fL (ref 80.0–100.0)
MCV: 91.4 fL (ref 80.0–100.0)
Platelets: 213 10*3/uL (ref 150–400)
Platelets: 224 10*3/uL (ref 150–400)
RBC: 4.55 MIL/uL (ref 3.87–5.11)
RBC: 4.53 MIL/uL (ref 3.87–5.11)
RDW: 14.9 % (ref 11.5–15.5)
RDW: 15 % (ref 11.5–15.5)
WBC: 9.2 10*3/uL (ref 4.0–10.5)
WBC: 9.3 10*3/uL (ref 4.0–10.5)
nRBC: 0 % (ref 0.0–0.2)
nRBC: 0 % (ref 0.0–0.2)

## 2019-06-04 LAB — CBC WITH DIFFERENTIAL/PLATELET
Abs Immature Granulocytes: 0.05 10*3/uL (ref 0.00–0.07)
Basophils Absolute: 0 10*3/uL (ref 0.0–0.1)
Basophils Relative: 0 %
Eosinophils Absolute: 0 10*3/uL (ref 0.0–0.5)
Eosinophils Relative: 0 %
HCT: 47.1 % — ABNORMAL HIGH (ref 36.0–46.0)
Hemoglobin: 15.2 g/dL — ABNORMAL HIGH (ref 12.0–15.0)
Immature Granulocytes: 0 %
Lymphocytes Relative: 14 %
Lymphs Abs: 1.7 10*3/uL (ref 0.7–4.0)
MCH: 29.3 pg (ref 26.0–34.0)
MCHC: 32.3 g/dL (ref 30.0–36.0)
MCV: 90.9 fL (ref 80.0–100.0)
Monocytes Absolute: 0.8 10*3/uL (ref 0.1–1.0)
Monocytes Relative: 7 %
Neutro Abs: 9.4 10*3/uL — ABNORMAL HIGH (ref 1.7–7.7)
Neutrophils Relative %: 79 %
Platelets: 242 10*3/uL (ref 150–400)
RBC: 5.18 MIL/uL — ABNORMAL HIGH (ref 3.87–5.11)
RDW: 14.9 % (ref 11.5–15.5)
WBC: 12.1 10*3/uL — ABNORMAL HIGH (ref 4.0–10.5)
nRBC: 0 % (ref 0.0–0.2)

## 2019-06-04 LAB — COMPREHENSIVE METABOLIC PANEL
ALT: 17 U/L (ref 0–44)
AST: 24 U/L (ref 15–41)
Albumin: 4.5 g/dL (ref 3.5–5.0)
Alkaline Phosphatase: 94 U/L (ref 38–126)
Anion gap: 10 (ref 5–15)
BUN: 18 mg/dL (ref 8–23)
CO2: 24 mmol/L (ref 22–32)
Calcium: 10.4 mg/dL — ABNORMAL HIGH (ref 8.9–10.3)
Chloride: 99 mmol/L (ref 98–111)
Creatinine, Ser: 0.83 mg/dL (ref 0.44–1.00)
GFR calc Af Amer: >60 mL/min (ref >60)
GFR calc non Af Amer: >60 mL/min (ref >60)
Glucose, Bld: 122 mg/dL — ABNORMAL HIGH (ref 70–99)
Potassium: 3.5 mmol/L (ref 3.5–5.1)
Sodium: 133 mmol/L — ABNORMAL LOW (ref 135–145)
Total Bilirubin: 0.5 mg/dL (ref 0.3–1.2)
Total Protein: 7.6 g/dL (ref 6.5–8.1)

## 2019-06-04 LAB — URINALYSIS, ROUTINE W REFLEX MICROSCOPIC
Bilirubin Urine: NEGATIVE
Glucose, UA: NEGATIVE mg/dL
Ketones, ur: NEGATIVE mg/dL
Nitrite: NEGATIVE
Protein, ur: NEGATIVE mg/dL
Specific Gravity, Urine: <1.005 — ABNORMAL LOW (ref 1.005–1.030)
pH: 6.5 (ref 5.0–8.0)

## 2019-06-04 LAB — URINALYSIS, MICROSCOPIC (REFLEX)

## 2019-06-04 LAB — OCCULT BLOOD X 1 CARD TO LAB, STOOL: Fecal Occult Bld: POSITIVE — AB

## 2019-06-04 LAB — LIPASE, BLOOD: Lipase: 20 U/L (ref 11–51)

## 2019-06-04 LAB — MRSA PCR SCREENING: MRSA by PCR: NEGATIVE

## 2019-06-04 MED ORDER — IOHEXOL 300 MG/ML  SOLN
100.0000 mL | Freq: Once | INTRAMUSCULAR | Status: AC | PRN
  Administered 2019-06-04: 18:00:00 100 mL via INTRAVENOUS

## 2019-06-04 MED ORDER — HYDRALAZINE HCL 20 MG/ML IJ SOLN
10.0000 mg | INTRAMUSCULAR | Status: DC | PRN
  Administered 2019-06-06: 02:00:00 10 mg via INTRAVENOUS
  Filled 2019-06-04 (×2): qty 1

## 2019-06-04 MED ORDER — ACETAMINOPHEN 650 MG RE SUPP: 650.0000 mg | Freq: Four times a day (QID) | RECTAL | Status: DC | PRN

## 2019-06-04 MED ORDER — ACETAMINOPHEN 325 MG PO TABS: 650.0000 mg | ORAL_TABLET | Freq: Four times a day (QID) | ORAL | Status: DC | PRN

## 2019-06-04 MED ORDER — ONDANSETRON HCL 4 MG PO TABS: 4.0000 mg | ORAL_TABLET | Freq: Four times a day (QID) | ORAL | Status: DC | PRN

## 2019-06-04 MED ORDER — ONDANSETRON HCL 4 MG/2ML IJ SOLN
4.0000 mg | Freq: Four times a day (QID) | INTRAMUSCULAR | Status: DC | PRN
  Administered 2019-06-06: 11:00:00 4 mg via INTRAVENOUS

## 2019-06-04 MED ORDER — CHLORHEXIDINE GLUCONATE CLOTH 2 % EX PADS
6.0000 | MEDICATED_PAD | Freq: Every day | CUTANEOUS | Status: DC
  Administered 2019-06-04 – 2019-06-05 (×2): 6 via TOPICAL

## 2019-06-04 MED ORDER — SODIUM CHLORIDE 0.9 % IV SOLN: INTRAVENOUS | Status: AC

## 2019-06-04 NOTE — ED Notes (Signed)
Pt passed lg amt of blood rectally in [facility]; this is the 4th episode since she arrived.

## 2019-06-04 NOTE — ED Notes (Signed)
DISREGARD BP 96/74.

## 2019-06-04 NOTE — ED Notes (Signed)
Pt had an episode pf rectal bleeding in [facility]; no stool noted; lg amt of blood noted.

## 2019-06-04 NOTE — ED Provider Notes (Signed)
[facility] EMERGENCY DEPARTMENT Provider Note   CSN: [id number] Arrival date & time: 06/04/19  1456     History Chief Complaint  Patient presents with  . Rectal Bleeding    Theresa Prince is a 79 y.o. female.  The history is provided by the patient.  Rectal Bleeding Quality:  Bright red Amount:  Moderate Duration:  4 hours Timing:  Intermittent Chronicity:  New Context comment:  History of polp ten years ago, denies any rectal pain or abdominal pain  Relieved by:  Nothing Worsened by:  Nothing Associated symptoms: no abdominal pain, no dizziness, no fever, no hematemesis, no loss of consciousness, no recent illness and no vomiting   Risk factors: no anticoagulant use, no hx of colorectal cancer, no hx of colorectal surgery and no hx of IBD   Risk factors comment:  On aspirin       Past Medical History:  Diagnosis Date  . Acute eczematoid otitis externa of both ears 06/30/2016  . Alopecia 01/27/2014  . Anxiety   . Arthritis    "all over" (05/13/2017)  . Bulging lumbar disc 01/27/2014  . CAD in native artery    a. Cardiac cath 05/14/17 showed multivessel disease with 25% Prox-distal RCA, 75% rPDA, 95% OM2 (treated with PTCA), 90% prox-mid Cx (treated with DES), 25% prox LAD< 80% mLAD (treated with DES), 50% mid-distal LAD, 80% distal LAD, LVEDP normal, LVEF 55-65%.  . Cancer ([place])    cervical (pre-cancerous)  . 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 Chronic lower back pain   . Clumsiness 02/27/2016  . Degenerative arthritis 01/27/2014  . Depression   . Diverticulosis   . Generalized osteoarthritis of multiple sites 07/17/2014  . High cholesterol   . History of stroke without residual deficits 07/17/2014  . Hypertension   . Ingrown nail 03/17/2017  . Lichen sclerosus 123XX123  . Mixed emotional features as adjustment reaction 01/27/2014  . Nasal congestion 06/30/2016  . Nasal vestibulitis  06/30/2016  . Neuralgia and neuritis 01/27/2014   Overview:  Overview:  Right anterior thigh Overview:  Right anterior thigh  . NSTEMI (non-ST elevated myocardial infarction) ([place]) 05/13/2017  . Osteopenia 01/27/2014  . PAD (peripheral artery disease) ([place])    a. CT 04/2017 showed peripheral calcified and noncalcified atherosclerotic plaque with atherosclerotic narrowing of the splenic artery and atherosclerotic narrowing  . Pain in toes of both feet 03/17/2017  . Perennial allergic rhinitis with seasonal variation 07/17/2014  . Perioral dermatitis 12/02/2016  . PFO (patent foramen ovale)    a. noted on echo 04/2017.  Marland Kitchen Pneumonia    "twice" (05/13/2017)  . Postlaminectomy syndrome, lumbar 01/27/2014  . Primary osteoarthritis of right knee 01/27/2014  . Slow transit constipation 07/17/2014  . Stroke [facility]) 2014   "speech issues; mostly resolved; still have trouble pronouncing some words" (05/13/2017)  . Trochanteric bursitis of both hips 07/17/2014  . Varicose vein of leg   . Varicose veins 01/27/2014  . 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 Vitamin D deficiency 01/27/2014    Patient Active Problem List   Diagnosis Date Noted  . Elevated transaminase level 06/14/2018  . Bilateral impacted cerumen 08/25/2017  . Abnormal auditory perception of both ears 08/25/2017  . PAD (peripheral artery disease) ([place]) 05/15/2017  . Diverticulosis   . High cholesterol   . Hypertension   . CAD in native artery   . PFO (patent foramen ovale)   . NSTEMI (non-ST  elevated myocardial infarction) ([place]) 05/13/2017  . Ingrown nail 03/17/2017  . Pain in toes of both feet 03/17/2017  . Perioral dermatitis 12/02/2016  . Acute eczematoid otitis externa of both ears 06/30/2016  . Nasal congestion 06/30/2016  . Nasal vestibulitis 06/30/2016  . Cerebrovascular accident (CVA) due to occlusion of left middle cerebral artery ([place]) 02/27/2016  . Clumsiness 02/27/2016  . Generalized osteoarthritis of  multiple sites 07/17/2014  . History of stroke without residual deficits 07/17/2014  . Perennial allergic rhinitis with seasonal variation 07/17/2014  . Slow transit constipation 07/17/2014  . Trochanteric bursitis of both hips 07/17/2014  . Alopecia 01/27/2014  . Bulging lumbar disc 01/27/2014  . Degenerative arthritis 01/27/2014  . Lichen sclerosus 123XX123  . Mixed emotional features as adjustment reaction 01/27/2014  . Neuralgia and neuritis 01/27/2014  . Osteopenia 01/27/2014  . Postlaminectomy syndrome, lumbar 01/27/2014  . Primary osteoarthritis of right knee 01/27/2014  . Varicose veins 01/27/2014  . 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 Vitamin D deficiency 01/27/2014    Past Surgical History:  Procedure Laterality Date  . BACK SURGERY    . CORONARY BALLOON ANGIOPLASTY N/A 05/14/2017   Procedure: CORONARY BALLOON ANGIOPLASTY;  Surgeon: Jettie Booze, MD;  Location: [facility];  Service: Cardiovascular;  Laterality: N/A;  OM2  . CORONARY STENT INTERVENTION N/A 05/14/2017   Procedure: CORONARY STENT INTERVENTION;  Surgeon: Jettie Booze, MD;  Location: [facility];  Service: Cardiovascular;  Laterality: N/A;  . CYST EXCISION  1999   "took off my spine"  . LEFT HEART CATH AND CORONARY ANGIOGRAPHY N/A 05/14/2017   Procedure: LEFT HEART CATH AND CORONARY ANGIOGRAPHY;  Surgeon: Jettie Booze, MD;  Location: [facility];  Service: Cardiovascular;  Laterality: N/A;  . VAGINAL HYSTERECTOMY  1982     OB History   No obstetric history on file.     No family history on file.  Social History   Tobacco Use  . Smoking status: Never Smoker  . Smokeless tobacco: Never Used  Substance Use Topics  . Alcohol use: No  . Drug use: No    Home Medications Prior to Admission medications   Medication Sig Start Date End Date Taking? Authorizing Provider  acetaminophen (TYLENOL) 650 MG CR tablet Take 650 mg by mouth every  8 (eight) hours as needed for pain.    [provider]  amLODipine (NORVASC) 10 MG tablet Take 10 mg by mouth daily.     [provider]  aspirin EC 81 MG tablet Take 81 mg by mouth daily.    [provider]  calcium carbonate (OS-CAL - DOSED IN MG OF ELEMENTAL CALCIUM) 1250 (500 Ca) MG tablet Take 1 tablet by mouth daily.    [provider]  calcium carbonate (TUMS - DOSED IN MG ELEMENTAL CALCIUM) 500 MG chewable tablet Chew 2 tablets by mouth 2 (two) times daily as needed for indigestion or heartburn.    [provider]  Calcium Citrate-Vitamin D (CALCIUM CITRATE+D3 PETITES PO) Take by mouth 2 (two) times daily.    [provider]  cholecalciferol (VITAMIN D) 1000 units tablet Take 1,000 Units by mouth daily.    [provider]  clotrimazole-betamethasone (LOTRISONE) cream Apply 1 application topically 2 (two) times daily. 02/28/16   [provider]  diazepam (VALIUM) 10 MG tablet Take 10 mg by mouth every 6 (six) hours as needed for anxiety.    [provider]  DOCOSAHEXAENOIC ACID  PO Take by mouth.    [provider]  docusate sodium (COLACE) 100 MG capsule Take 200 mg by mouth at bedtime.    [provider]  doxycycline (PERIOSTAT) 20 MG tablet Take 20 mg by mouth 2 (two) times daily.    [provider]  enalapril (VASOTEC) 20 MG tablet Take 20 mg by mouth daily.    [provider]  fluticasone (FLONASE) 50 MCG/ACT nasal spray Place 1 spray into both nostrils daily.    [provider]  furosemide (LASIX) 20 MG tablet Take 20 mg by mouth daily as needed. fluid 03/02/16   [provider]  HYDROcodone-acetaminophen (NORCO/VICODIN) 5-325 MG tablet Take 1 tablet by mouth 2 (two) times daily.     [provider]  loratadine (CLARITIN) 10 MG tablet Take 10 mg by mouth daily. Only takes in Summer    [provider]  meclizine (ANTIVERT) 12.5 MG tablet  Take 12.5 mg by mouth 3 (three) times daily as needed for dizziness.    [provider]  metoprolol succinate (TOPROL-XL) 25 MG 24 hr tablet Take 25 mg by mouth daily.    [provider]  metroNIDAZOLE (METROGEL) 0.75 % gel Apply 1 application topically 2 (two) times daily.    [provider]  montelukast (SINGULAIR) 10 MG tablet Take 10 mg by mouth at bedtime.    [provider]  Multiple Vitamins-Minerals (MULTIVITAMIN WITH MINERALS) tablet Take 1 tablet by mouth daily.     [provider]  neomycin-polymyxin-gramicidin (NEOSPORIN) 1.75-10000-.025 ophthalmic solution Place 1 drop into both eyes 4 (four) times daily.    [provider]  nitroGLYCERIN (NITROSTAT) 0.4 MG SL tablet DISSOLVE ONE TABLET UNDER TONGUE EVERY 5 MINUTES AS NEEDED UP TO 3 DOSES. IF NORELIEF CALL 911. 03/17/19   Park Liter, MD  Omega-3 Krill Oil 500 MG CAPS Take by mouth daily.    [provider]  polyvinyl alcohol (LIQUIFILM TEARS) 1.4 % ophthalmic solution Place 2 drops into both eyes as needed for dry eyes.    [provider]  potassium chloride (K-DUR) 10 MEQ tablet Take 10 mEq by mouth daily as needed. Take 1 capsule by mouth daily with the use of lasix as directed    [provider]  tiZANidine (ZANAFLEX) 4 MG tablet Take 4 mg by mouth every 8 (eight) hours as needed for muscle spasms.    [provider]    Allergies    Codeine, Fosamax [alendronate sodium], and Simvastatin  Review of Systems   Review of Systems  Constitutional: Negative for chills and fever.  HENT: Negative for ear pain and sore throat.   Eyes: Negative for pain and visual disturbance.  Respiratory: Negative for cough and shortness of breath.   Cardiovascular: Negative for chest pain and palpitations.  Gastrointestinal: Positive for anal bleeding and hematochezia. Negative for abdominal distention, abdominal pain, constipation, diarrhea, hematemesis,  nausea, rectal pain and vomiting.  Genitourinary: Negative for dysuria and hematuria.  Musculoskeletal: Negative for arthralgias and back pain.  Skin: Negative for color change and rash.  Neurological: Negative for dizziness, seizures, loss of consciousness and syncope.  All other systems reviewed and are negative.   Physical Exam Updated Vital Signs  ED Triage Vitals  Enc Vitals Group     BP 06/04/19 1514 (!) 166/95     Pulse Rate 06/04/19 1514 100     Resp 06/04/19 1514 18     Temp 06/04/19 1514 97.9 F (36.6 C)  Temp Source 06/04/19 1514 Oral     SpO2 06/04/19 1514 99 %     Weight 06/04/19 1514 180 lb (81.6 kg)     Height 06/04/19 1514 5\' 3"  (1.6 m)     Head Circumference --      Peak Flow --      Pain Score 06/04/19 1519 0     Pain Loc --      Pain Edu? --      Excl. in [place]? --     Physical Exam Vitals and nursing note reviewed.  Constitutional:      General: She is not in acute distress.    Appearance: She is well-developed. She is not ill-appearing.  HENT:     Head: Normocephalic and atraumatic.     Nose: Nose normal.     Mouth/Throat:     Mouth: Mucous membranes are moist.  Eyes:     Extraocular Movements: Extraocular movements intact.     Conjunctiva/sclera: Conjunctivae normal.     Pupils: Pupils are equal, round, and reactive to light.  Cardiovascular:     Rate and Rhythm: Normal rate and regular rhythm.     Pulses: Normal pulses.     Heart sounds: Normal heart sounds. No murmur.  Pulmonary:     Effort: Pulmonary effort is normal. No respiratory distress.     Breath sounds: Normal breath sounds.  Abdominal:     General: There is no distension.     Palpations: Abdomen is soft.     Tenderness: There is no abdominal tenderness.  Genitourinary:    Rectum: Guaiac result positive (red blood stained around rectum but no obvious hemorrhoids/mass/nontender).  Musculoskeletal:     Cervical back: Normal range of motion and neck supple.  Skin:    General:  Skin is warm and dry.     Capillary Refill: Capillary refill takes less than 2 seconds.  Neurological:     General: No focal deficit present.     Mental Status: She is alert.  Psychiatric:        Mood and Affect: Mood normal.     ED Results / Procedures / Treatments   Labs (all labs ordered are listed, but only abnormal results are displayed) Labs Reviewed  CBC WITH DIFFERENTIAL/PLATELET - Abnormal; Notable for the following components:      Result Value   WBC 12.1 (*)    RBC 5.18 (*)    Hemoglobin 15.2 (*)    HCT 47.1 (*)    Neutro Abs 9.4 (*)    All other components within normal limits  COMPREHENSIVE METABOLIC PANEL - Abnormal; Notable for the following components:   Sodium 133 (*)    Glucose, Bld 122 (*)    Calcium 10.4 (*)    All other components within normal limits  URINALYSIS, ROUTINE W REFLEX MICROSCOPIC - Abnormal; Notable for the following components:   Specific Gravity, Urine <1.005 (*)    Hgb urine dipstick LARGE (*)    Leukocytes,Ua TRACE (*)    All other components within normal limits  OCCULT BLOOD X 1 CARD TO LAB, STOOL - Abnormal; Notable for the following components:   Fecal Occult Bld POSITIVE (*)    All other components within normal limits  URINALYSIS, MICROSCOPIC (REFLEX) - Abnormal; Notable for the following components:   Bacteria, UA MANY (*)    All other components within normal limits  SARS CORONAVIRUS 2 (TAT 6-24 HRS)  URINE CULTURE  LIPASE, BLOOD  POC OCCULT BLOOD, ED  EKG None  Radiology CT ABDOMEN PELVIS W CONTRAST  Result Date: 06/04/2019 CLINICAL DATA:  79 year old female with history of abdominal distension. EXAM: CT ABDOMEN AND PELVIS WITH CONTRAST TECHNIQUE: Multidetector CT imaging of the abdomen and pelvis was performed using the standard protocol following bolus administration of intravenous contrast. CONTRAST:  141mL OMNIPAQUE IOHEXOL 300 MG/ML  SOLN COMPARISON:  No priors. FINDINGS: Lower chest: Aortic atherosclerosis.  Atherosclerotic calcifications in the left anterior descending, left circumflex and right coronary arteries. Calcifications of the aortic valve. Small hiatal hernia. Hepatobiliary: No suspicious cystic or solid hepatic lesions. No intra or extrahepatic biliary ductal dilatation. Several noncalcified gallstones are noted within the gallbladder measuring up to 2.7 cm. No findings to suggest an acute cholecystitis at this time. Pancreas: No pancreatic mass. No pancreatic ductal dilatation. No pancreatic or peripancreatic fluid collections or inflammatory changes. Spleen: Unremarkable. Adrenals/Urinary Tract: Subcentimeter low-attenuation lesions in both kidneys, too small to characterize, but statistically likely to represent tiny cysts. No hydroureteronephrosis. Urinary bladder is normal in appearance. Bilateral adrenal glands are normal in appearance. Stomach/Bowel: Normal appearance of the stomach. No pathologic dilatation of small bowel or colon. A few scattered colonic diverticulae are noted, without surrounding inflammatory changes to suggest an acute diverticulitis at this time. Normal appendix. Vascular/Lymphatic: Aortic atherosclerosis, without evidence of aneurysm or dissection in the abdominal or pelvic vasculature. No lymphadenopathy noted in the abdomen or pelvis. Reproductive: Status post hysterectomy. Ovaries are not confidently identified may be surgically absent or atrophic. Other: No significant volume of ascites.  No pneumoperitoneum. Musculoskeletal: There are no aggressive appearing lytic or blastic lesions noted in the visualized portions of the skeleton. IMPRESSION: 1. No acute findings are noted in the abdomen or pelvis to account for the patient's symptoms. 2. Cholelithiasis without evidence of acute cholecystitis at this time. 3. Colonic diverticulosis without evidence of acute diverticulitis at this time. 4. Aortic atherosclerosis, in addition to least 3 vessel coronary artery disease.  Assessment for potential risk factor modification, dietary therapy or pharmacologic therapy may be warranted, if clinically indicated. 5. There are calcifications of the aortic valve. Echocardiographic correlation for evaluation of potential valvular dysfunction may be warranted if clinically indicated. 6. Small hiatal hernia. 7. Additional incidental findings, as above. Electronically Signed   By: Vinnie Langton M.D.   On: 06/04/2019 18:03    Procedures Procedures (including critical care time)  Medications Ordered in ED Medications  iohexol (OMNIPAQUE) 300 MG/ML solution 100 mL (100 mLs Intravenous Contrast Given 06/04/19 1732)    ED Course  I have reviewed the triage vital signs and the nursing notes.  Pertinent labs & imaging results that were available during my care of the patient were reviewed by me and considered in my medical decision making (see chart for details).    MDM Rules/Calculators/A&P                      Theresa Prince is a 79 year old female with history of CAD, hypertension who presents the ED with rectal bleeding.  Patient with unremarkable vitals.  No fever.  About 3 hours prior to arrival patient started to have bright red blood per rectum.  This has been painless.  She has had 4 episodes over the last several hours.  Just had bright red blood per rectum while in triage.  Denies any chest pain, shortness of breath, lightheadedness.  Has no rectal tenderness on exam.  She has dried blood around the rectum and clots.  States that she had  a colonoscopy maybe 10 years ago and had a polyp removed.  No history of hemorrhoids.  No obvious hemorrhoids on exam.  We will get basic lab work including CT scan abdomen and pelvis.  Suspect possibly internal hemorrhoid versus polyp.  No history of abdominal surgeries.  No history of AAA.  No history of Crohn's or other inflammatory bowel disease.  Anticipate admission for further observation.  Hemoglobin 15.  Otherwise no  significant anemia, electrolyte abnormality, kidney injury.  CT scan of the abdomen pelvis unremarkable for source of GI bleed.  Patient continues to have some intermittent bloody bowel movements.  However hemodynamically stable.  Not having any pain.  Talked with Dr. Alessandra Bevels with GI and they will evaluate the patient once admitted over to [facility] long.  I have discussed case with hospitalist and patient to be admitted.  Hemodynamically stable at this time.  Will repeat hemoglobin if patient has prolonged stay.  Suspect lower GI bleed possibly from polyp or hemorrhoid or angiodysplasia.  This chart was dictated using voice recognition software.  Despite best efforts to proofread,  errors can occur which can change the documentation meaning.    Final Clinical Impression(s) / ED Diagnoses Final diagnoses:  Rectal bleeding    Rx / DC Orders ED Discharge Orders    None       Lennice Sites, DO 06/04/19 [id number]

## 2019-06-04 NOTE — Plan of Care (Signed)
[name] presented with rectal bleed past hx CAd, CVA, PAd Takes daily [facility] at [facility] hg 15 BP 130- 166/95 Red blood per rectum CT abd non acute [facility] consulted Dr. Alessandra Bevels Pt continues to pass large amount of blood per rectum  covid pending  Accepted to Stepdown  7:30 PM Theresa Prince

## 2019-06-04 NOTE — H&P (Signed)
History and Physical    Theresa Prince [id number] DOB: 1940-11-10 DOA: 06/04/2019  PCP: Manfred Shirts, PA  Patient coming from: Home.  Chief Complaint: Rectal bleeding.  HPI: Theresa Prince is a 79 y.o. female with history of CAD status post stenting in February 2019, history of stroke with PFO presently on aspirin presents to the ER admits in the room with multiple episodes of rectal bleeding since this afternoon.  Denies any abdominal pain nausea vomiting fever or chills.  Has not had any previous history of rectal bleeding.  Denies any chest pain or shortness of breath fever chills.  Patient does take naproxen but denies any melena.  ED Course: In the ER at [facility] patient was hemodynamically stable.  Initial hemoglobin was 15 point 2 repeat was 13.2.  On-call gastroenterologist for [facility] Dr. Sharlene Motts has been consulted and patient admitted for further management.  CT abdomen pelvis does show colonic diverticulosis without anything acute.  Just prior to my exam patient had 2 more large bowel movement no frank rectal bleeding.  Covid test is pending.  Review of Systems: As per HPI, rest all negative.   Past Medical History:  Diagnosis Date   Acute eczematoid otitis externa of both ears 06/30/2016   Alopecia 01/27/2014   Anxiety    Arthritis    "all over" (05/13/2017)   Bulging lumbar disc 01/27/2014   CAD in native artery    a. Cardiac cath 05/14/17 showed multivessel disease with 25% Prox-distal RCA, 75% rPDA, 95% OM2 (treated with PTCA), 90% prox-mid Cx (treated with DES), 25% prox LAD< 80% mLAD (treated with DES), 50% mid-distal LAD, 80% distal LAD, LVEDP normal, LVEF 55-65%.   Cancer ([place])    cervical (pre-cancerous)   Cerebrovascular accident (CVA) due to occlusion of left middle cerebral artery ([place]) 02/27/2016   Chronic lower back pain    Clumsiness 02/27/2016   Degenerative arthritis 01/27/2014   Depression    Diverticulosis     Generalized osteoarthritis of multiple sites 07/17/2014   High cholesterol    History of stroke without residual deficits 07/17/2014   Hypertension    Ingrown nail 123XX123   Lichen sclerosus 123XX123   Mixed emotional features as adjustment reaction 01/27/2014   Nasal congestion 06/30/2016   Nasal vestibulitis 06/30/2016   Neuralgia and neuritis 01/27/2014   Overview:  Overview:  Right anterior thigh Overview:  Right anterior thigh   NSTEMI (non-ST elevated myocardial infarction) ([place]) 05/13/2017   Osteopenia 01/27/2014   PAD (peripheral artery disease) ([place])    a. CT 04/2017 showed peripheral calcified and noncalcified atherosclerotic plaque with atherosclerotic narrowing of the splenic artery and atherosclerotic narrowing   Pain in toes of both feet 03/17/2017   Perennial allergic rhinitis with seasonal variation 07/17/2014   Perioral dermatitis 12/02/2016   PFO (patent foramen ovale)    a. noted on echo 04/2017.   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   Vitamin D deficiency 01/27/2014    Past Surgical History:  Procedure Laterality Date   BACK SURGERY     CORONARY BALLOON ANGIOPLASTY N/A 05/14/2017  Procedure: CORONARY BALLOON ANGIOPLASTY;  Surgeon: Jettie Booze, MD;  Location: [place] [facility];  Service: Cardiovascular;  Laterality: N/A;  OM2   CORONARY STENT INTERVENTION N/A 05/14/2017   Procedure: CORONARY STENT INTERVENTION;  Surgeon: Jettie Booze, MD;  Location: [place] [facility];  Service: Cardiovascular;  Laterality: N/A;   CYST EXCISION   1999   "took off my spine"   LEFT HEART CATH AND CORONARY ANGIOGRAPHY N/A 05/14/2017   Procedure: LEFT HEART CATH AND CORONARY ANGIOGRAPHY;  Surgeon: Jettie Booze, MD;  Location: [place] [facility];  Service: Cardiovascular;  Laterality: N/A;   VAGINAL HYSTERECTOMY  1982     reports that she has never smoked. She has never used smokeless tobacco. She reports that she does not drink alcohol or use drugs.  Allergies  Allergen Reactions   Codeine Nausea And Vomiting   Fosamax [Alendronate Sodium] Nausea And Vomiting   Simvastatin     "legs weak"    Family History  Family history unknown: Yes    Prior to Admission medications   Medication Sig Start Date End Date Taking? Authorizing Provider  amLODipine (NORVASC) 10 MG tablet Take 10 mg by mouth daily.    Yes [provider]  Ascorbic Acid (VITAMIN C WITH ROSE HIPS) 500 MG tablet Take 500 mg by mouth daily.   Yes [provider]  aspirin EC 81 MG tablet Take 81 mg by mouth daily.   Yes [provider]  calcium carbonate (TUMS - DOSED IN MG ELEMENTAL CALCIUM) 500 MG chewable tablet Chew 2 tablets by mouth 2 (two) times daily as needed for indigestion or heartburn.   Yes [provider]  Calcium Citrate-Vitamin D (CALCIUM CITRATE+D3 PETITES PO) Take 1 tablet by mouth in the morning and at bedtime.    Yes [provider]  cholecalciferol (VITAMIN D) 1000 units tablet Take 1,000 Units by mouth daily.   Yes [provider]  docusate sodium (COLACE) 100 MG capsule Take 200 mg by mouth at bedtime.   Yes [provider]  doxycycline (PERIOSTAT) 20 MG tablet Take 20 mg by mouth 2 (two) times daily.   Yes [provider]  enalapril (VASOTEC) 20 MG tablet Take 20 mg by mouth daily.   Yes [provider]  HYDROcodone-acetaminophen (NORCO/VICODIN) 5-325 MG tablet Take 1 tablet by mouth 2 (two) times daily.    Yes [provider]  ketoconazole (NIZORAL)  2 % shampoo Apply 1 application topically 3 (three) times a week.  05/09/19  Yes [provider]  loratadine (CLARITIN) 10 MG tablet Take 10 mg by mouth daily. Only takes in Summer   Yes [provider]  metoprolol succinate (TOPROL-XL) 25 MG 24 hr tablet Take 25 mg by mouth daily.   Yes [provider]  montelukast (SINGULAIR) 10 MG tablet Take 10 mg by mouth at bedtime.   Yes [provider]  Multiple Vitamins-Minerals (MULTIVITAMIN WITH MINERALS) tablet Take 1 tablet by mouth daily.    Yes [provider]  naproxen (NAPROSYN) 500 MG tablet Take 500 mg by mouth 2 (two) times daily with a meal.  11/28/18  Yes [provider]  Omega-3 Krill Oil 500 MG CAPS Take 500 mg by mouth daily.    Yes [provider]  rosuvastatin (CRESTOR) 20 MG tablet Take 20 mg by mouth See admin instructions. Three times a week on Tuesday, Wednesday and Saturday 11/30/18  Yes [provider]  Zinc 50 MG TABS Take  50 mg by mouth daily.   Yes [provider]  nitroGLYCERIN (NITROSTAT) 0.4 MG SL tablet DISSOLVE ONE TABLET UNDER TONGUE EVERY 5 MINUTES AS NEEDED UP TO 3 DOSES. IF NORELIEF CALL 911. Patient taking differently: Place 0.4 mg under the tongue every 5 (five) minutes as needed for chest pain.  03/17/19   Park Liter, MD    Physical Exam: Constitutional: Moderately built and nourished. Vitals:   06/04/19 1700 06/04/19 1844 06/04/19 1930 06/04/19 2030  BP: (!) 153/127 139/79 128/71 133/72  Pulse: 91 98 91 88  Resp: 16 19 19  (!) 24  Temp:      TempSrc:      SpO2: 100% 100% 97% 97%  Weight:      Height:       Eyes: Anicteric no pallor. ENMT: No discharge from the ears eyes nose or mouth. Neck: No mass felt.  No neck rigidity. Respiratory: No rhonchi or crepitations. Cardiovascular: S1-S2 heard. Abdomen: Soft nontender bowel sounds present. Musculoskeletal: No edema. Skin: No rash. Neurologic: Alert awake oriented to time  place and person.  Moves all extremities. Psychiatric: Appears normal per normal affect.   Labs on Admission: I have personally reviewed following labs and imaging studies  CBC: Recent Labs  Lab 06/04/19 1606 06/04/19 2033  WBC 12.1* 9.2  NEUTROABS 9.4*  --   HGB 15.2* 13.2  HCT 47.1* 41.7  MCV 90.9 91.6  PLT 242 [id number]   Basic Metabolic Panel: Recent Labs  Lab 06/04/19 1606  NA 133*  K 3.5  CL 99  CO2 24  GLUCOSE 122*  BUN 18  CREATININE 0.83  CALCIUM 10.4*   GFR: Estimated Creatinine Clearance: 55.6 mL/min (by C-G formula based on SCr of 0.83 mg/dL). Liver Function Tests: Recent Labs  Lab 06/04/19 1606  AST 24  ALT 17  ALKPHOS 94  BILITOT 0.5  PROT 7.6  ALBUMIN 4.5   Recent Labs  Lab 06/04/19 1606  LIPASE 20   No results for input(s): AMMONIA in the last 168 hours. Coagulation Profile: No results for input(s): INR, PROTIME in the last 168 hours. Cardiac Enzymes: No results for input(s): CKTOTAL, CKMB, CKMBINDEX, TROPONINI in the last 168 hours. BNP (last 3 results) No results for input(s): PROBNP in the last 8760 hours. HbA1C: No results for input(s): HGBA1C in the last 72 hours. CBG: No results for input(s): GLUCAP in the last 168 hours. Lipid Profile: No results for input(s): CHOL, HDL, LDLCALC, TRIG, CHOLHDL, LDLDIRECT in the last 72 hours. Thyroid Function Tests: No results for input(s): TSH, T4TOTAL, FREET4, T3FREE, THYROIDAB in the last 72 hours. Anemia Panel: No results for input(s): VITAMINB12, FOLATE, FERRITIN, TIBC, IRON, RETICCTPCT in the last 72 hours. Urine analysis:    Component Value Date/Time   COLORURINE YELLOW 06/04/2019 1612   APPEARANCEUR CLEAR 06/04/2019 1612   LABSPEC <1.005 (L) 06/04/2019 1612   PHURINE 6.5 06/04/2019 1612   GLUCOSEU NEGATIVE 06/04/2019 1612   HGBUR LARGE (A) 06/04/2019 1612   BILIRUBINUR NEGATIVE 06/04/2019 1612   KETONESUR NEGATIVE 06/04/2019 1612   PROTEINUR NEGATIVE 06/04/2019 1612   NITRITE  NEGATIVE 06/04/2019 1612   LEUKOCYTESUR TRACE (A) 06/04/2019 1612   Sepsis Labs: @LABRCNTIP (procalcitonin:4,lacticidven:4) )No results found for this or any previous visit (from the past 240 hour(s)).   Radiological Exams on Admission: CT ABDOMEN PELVIS W CONTRAST  Result Date: 06/04/2019 CLINICAL DATA:  79 year old female with history of abdominal distension. EXAM: CT ABDOMEN AND PELVIS WITH CONTRAST TECHNIQUE: Multidetector CT imaging of the abdomen  and pelvis was performed using the standard protocol following bolus administration of intravenous contrast. CONTRAST:  19mL OMNIPAQUE IOHEXOL 300 MG/ML  SOLN COMPARISON:  No priors. FINDINGS: Lower chest: Aortic atherosclerosis. Atherosclerotic calcifications in the left anterior descending, left circumflex and right coronary arteries. Calcifications of the aortic valve. Small hiatal hernia. Hepatobiliary: No suspicious cystic or solid hepatic lesions. No intra or extrahepatic biliary ductal dilatation. Several noncalcified gallstones are noted within the gallbladder measuring up to 2.7 cm. No findings to suggest an acute cholecystitis at this time. Pancreas: No pancreatic mass. No pancreatic ductal dilatation. No pancreatic or peripancreatic fluid collections or inflammatory changes. Spleen: Unremarkable. Adrenals/Urinary Tract: Subcentimeter low-attenuation lesions in both kidneys, too small to characterize, but statistically likely to represent tiny cysts. No hydroureteronephrosis. Urinary bladder is normal in appearance. Bilateral adrenal glands are normal in appearance. Stomach/Bowel: Normal appearance of the stomach. No pathologic dilatation of small bowel or colon. A few scattered colonic diverticulae are noted, without surrounding inflammatory changes to suggest an acute diverticulitis at this time. Normal appendix. Vascular/Lymphatic: Aortic atherosclerosis, without evidence of aneurysm or dissection in the abdominal or pelvic vasculature. No  lymphadenopathy noted in the abdomen or pelvis. Reproductive: Status post hysterectomy. Ovaries are not confidently identified may be surgically absent or atrophic. Other: No significant volume of ascites.  No pneumoperitoneum. Musculoskeletal: There are no aggressive appearing lytic or blastic lesions noted in the visualized portions of the skeleton. IMPRESSION: 1. No acute findings are noted in the abdomen or pelvis to account for the patient's symptoms. 2. Cholelithiasis without evidence of acute cholecystitis at this time. 3. Colonic diverticulosis without evidence of acute diverticulitis at this time. 4. Aortic atherosclerosis, in addition to least 3 vessel coronary artery disease. Assessment for potential risk factor modification, dietary therapy or pharmacologic therapy may be warranted, if clinically indicated. 5. There are calcifications of the aortic valve. Echocardiographic correlation for evaluation of potential valvular dysfunction may be warranted if clinically indicated. 6. Small hiatal hernia. 7. Additional incidental findings, as above. Electronically Signed   By: Vinnie Langton M.D.   On: 06/04/2019 18:03    EKG: Independently reviewed.  Normal sinus rhythm.  Assessment/Plan Principal Problem:   Lower GI bleed Active Problems:   CAD in native artery   PFO (patent foramen ovale)   Acute GI bleeding    1. Acute lower GI bleed likely from diverticulosis.  Will hold aspirin for now.  Discussed with Dr. Sharlene Motts on-call gastroenterologist.  If there is any further bleed will need a bleeding scan.  Follow serial CBCs.  N.p.o. type and screen. 2. Hypertension we will keep patient on as needed IV hydralazine.  Presently n.p.o. 3. CAD status post stenting in 2019 February presently denies any chest pain.  Presently n.p.o.  Holding aspirin. 4. History of stroke and history of PFO presently n.p.o. holding aspirin.  Covid test is pending.   DVT prophylaxis: SCDs.  Avoiding  anticoagulation secondary to GI bleed. Code Status: Full code. Family Communication: Discussed with patient. Disposition Plan: Home. Consults called: Gastroenterologist. Admission status: Observation.   Rise Patience MD [facility] Pager [phone].  If 7PM-7AM, please contact night-coverage [email or web address] Password [facility]  06/04/2019, 10:06 PM

## 2019-06-04 NOTE — ED Notes (Signed)
Assisted pt to [facility]- passed blood, but amt was less this time; pt in NAD.

## 2019-06-04 NOTE — ED Triage Notes (Signed)
Pt states she thought she was having diarrhea today today around 1200 but "it was only blood". States she has felt blood coming out even when she doesn't have to have a BM. Takes NSAIDs daily

## 2019-06-05 DIAGNOSIS — Q211 Atrial septal defect: Secondary | ICD-10-CM | POA: Diagnosis not present

## 2019-06-05 DIAGNOSIS — K625 Hemorrhage of anus and rectum: Principal | ICD-10-CM

## 2019-06-05 DIAGNOSIS — D649 Anemia, unspecified: Secondary | ICD-10-CM | POA: Diagnosis not present

## 2019-06-05 DIAGNOSIS — I2510 Atherosclerotic heart disease of native coronary artery without angina pectoris: Secondary | ICD-10-CM

## 2019-06-05 DIAGNOSIS — K922 Gastrointestinal hemorrhage, unspecified: Secondary | ICD-10-CM | POA: Diagnosis not present

## 2019-06-05 LAB — CBC
HCT: 36.6 % (ref 36.0–46.0)
HCT: 35.7 % — ABNORMAL LOW (ref 36.0–46.0)
HCT: 34.7 % — ABNORMAL LOW (ref 36.0–46.0)
Hemoglobin: 11.6 g/dL — ABNORMAL LOW (ref 12.0–15.0)
Hemoglobin: 11.3 g/dL — ABNORMAL LOW (ref 12.0–15.0)
Hemoglobin: 11.1 g/dL — ABNORMAL LOW (ref 12.0–15.0)
MCH: 29.4 pg (ref 26.0–34.0)
MCH: 29.3 pg (ref 26.0–34.0)
MCH: 29.5 pg (ref 26.0–34.0)
MCHC: 31.7 g/dL (ref 30.0–36.0)
MCHC: 31.7 g/dL (ref 30.0–36.0)
MCHC: 32 g/dL (ref 30.0–36.0)
MCV: 92.9 fL (ref 80.0–100.0)
MCV: 92.5 fL (ref 80.0–100.0)
MCV: 92.3 fL (ref 80.0–100.0)
Platelets: 206 10*3/uL (ref 150–400)
Platelets: 189 10*3/uL (ref 150–400)
Platelets: 179 10*3/uL (ref 150–400)
RBC: 3.94 MIL/uL (ref 3.87–5.11)
RBC: 3.86 MIL/uL — ABNORMAL LOW (ref 3.87–5.11)
RBC: 3.76 MIL/uL — ABNORMAL LOW (ref 3.87–5.11)
RDW: 15 % (ref 11.5–15.5)
RDW: 15.1 % (ref 11.5–15.5)
RDW: 15.2 % (ref 11.5–15.5)
WBC: 8 10*3/uL (ref 4.0–10.5)
WBC: 6.9 10*3/uL (ref 4.0–10.5)
WBC: 7 10*3/uL (ref 4.0–10.5)
nRBC: 0 % (ref 0.0–0.2)
nRBC: 0 % (ref 0.0–0.2)
nRBC: 0 % (ref 0.0–0.2)

## 2019-06-05 LAB — URINE CULTURE

## 2019-06-05 LAB — BASIC METABOLIC PANEL
Anion gap: 9 (ref 5–15)
BUN: 15 mg/dL (ref 8–23)
CO2: 24 mmol/L (ref 22–32)
Calcium: 9.1 mg/dL (ref 8.9–10.3)
Chloride: 107 mmol/L (ref 98–111)
Creatinine, Ser: 0.74 mg/dL (ref 0.44–1.00)
GFR calc Af Amer: >60 mL/min (ref >60)
GFR calc non Af Amer: >60 mL/min (ref >60)
Glucose, Bld: 112 mg/dL — ABNORMAL HIGH (ref 70–99)
Potassium: 3.8 mmol/L (ref 3.5–5.1)
Sodium: 140 mmol/L (ref 135–145)

## 2019-06-05 LAB — ABO/RH: ABO/RH(D): A POS

## 2019-06-05 LAB — TYPE AND SCREEN
ABO/RH(D): A POS
Antibody Screen: NEGATIVE

## 2019-06-05 LAB — SARS CORONAVIRUS 2 (TAT 6-24 HRS): SARS Coronavirus 2: NEGATIVE

## 2019-06-05 MED ORDER — HYDROCODONE-ACETAMINOPHEN 5-325 MG PO TABS
1.0000 | ORAL_TABLET | Freq: Two times a day (BID) | ORAL | Status: DC
  Administered 2019-06-05 – 2019-06-06 (×3): 1 via ORAL
  Filled 2019-06-05 (×3): qty 1

## 2019-06-05 MED ORDER — ALUM & MAG HYDROXIDE-SIMETH 200-200-20 MG/5ML PO SUSP
30.0000 mL | Freq: Four times a day (QID) | ORAL | Status: DC | PRN
  Administered 2019-06-05: 30 mL via ORAL
  Filled 2019-06-05: qty 30

## 2019-06-05 MED ORDER — METOPROLOL SUCCINATE ER 25 MG PO TB24
25.0000 mg | ORAL_TABLET | Freq: Every day | ORAL | Status: DC
  Administered 2019-06-05 – 2019-06-06 (×2): 25 mg via ORAL
  Filled 2019-06-05 (×2): qty 1

## 2019-06-05 MED ORDER — PEG 3350-KCL-NA BICARB-NACL 420 G PO SOLR
4000.0000 mL | Freq: Once | ORAL | Status: AC
  Administered 2019-06-05: 4000 mL via ORAL
  Filled 2019-06-05: qty 4000

## 2019-06-05 NOTE — Consult Note (Signed)
Referring Provider: EDP Primary Care Physician:  Manfred Shirts, PA Primary Gastroenterologist:  Althia Forts  Reason for Consultation:  GI bleed   HPI: Theresa Prince is a 79 y.o. female with past medical history of coronary artery disease status post PCI in 2019, history of stroke with PFO was on aspirin presented to the hospital with rectal bleeding.  GI is consulted for further evaluation.    [name].  Hemoglobin dropped to 11.3 this morning.  CT abdomen pelvis with contrast negative for any acute changes and showed colonic diverticulosis.  Normal LFTs.  Normal lipase.  Negative Covid.  Patient seen and examined at bedside.  She had urgency for bowel movement yesterday afternoon and subsequently noted bright blood in the commode.  She had multiple episodes of rectal bleeding since then.  Denied any associated abdominal pain, nausea or vomiting.  Denied any previous GI symptoms.  Denies weight loss.  Last colonoscopy more than 10 years ago at [facility] was normal.  No family history of colon cancer. Past Medical History:  Diagnosis Date  . Acute eczematoid otitis externa of both ears 06/30/2016  . Alopecia 01/27/2014  . Anxiety   . Arthritis    "all over" (05/13/2017)  . Bulging lumbar disc 01/27/2014  . CAD in native artery    a. Cardiac cath 05/14/17 showed multivessel disease with 25% Prox-distal RCA, 75% rPDA, 95% OM2 (treated with PTCA), 90% prox-mid Cx (treated with DES), 25% prox LAD< 80% mLAD (treated with DES), 50% mid-distal LAD, 80% distal LAD, LVEDP normal, LVEF 55-65%.  . Cancer ([place])    cervical (pre-cancerous)  . Cerebrovascular accident (CVA) due to occlusion of left middle cerebral artery ([place]) 02/27/2016  . Chronic lower back pain   . Clumsiness 02/27/2016  . Degenerative arthritis 01/27/2014  . Depression   . Diverticulosis   . Generalized osteoarthritis of multiple sites 07/17/2014  . High cholesterol   . History of stroke  without residual deficits 07/17/2014  . Hypertension   . Ingrown nail 03/17/2017  . Lichen sclerosus [id number]  . Mixed emotional features as adjustment reaction 01/27/2014  . Nasal congestion 06/30/2016  . Nasal vestibulitis 06/30/2016  . Neuralgia and neuritis 01/27/2014   Overview:  Overview:  Right anterior thigh Overview:  Right anterior thigh  . NSTEMI (non-ST elevated myocardial infarction) ([place]) 05/13/2017  . Osteopenia 01/27/2014  . PAD (peripheral artery disease) ([place])    a. CT 04/2017 showed peripheral calcified and noncalcified atherosclerotic plaque with atherosclerotic narrowing of the splenic artery and atherosclerotic narrowing  . Pain in toes of both feet 03/17/2017  . Perennial allergic rhinitis with seasonal variation 07/17/2014  . Perioral dermatitis 12/02/2016  . PFO (patent foramen ovale)    a. noted on echo 04/2017.  Marland Kitchen Pneumonia    "twice" (05/13/2017)  . Postlaminectomy syndrome, lumbar 01/27/2014  . Primary osteoarthritis of right knee 01/27/2014  . Slow transit constipation 07/17/2014  . Stroke [facility]) 2014   "speech issues; mostly resolved; still have trouble pronouncing some words" (05/13/2017)  . Trochanteric bursitis of both hips 07/17/2014  . Varicose vein of leg   . Varicose veins 01/27/2014  . 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 Vitamin D deficiency 01/27/2014    Past Surgical History:  Procedure Laterality Date  . BACK SURGERY    . CORONARY BALLOON ANGIOPLASTY N/A 05/14/2017   Procedure: CORONARY BALLOON ANGIOPLASTY;  Surgeon: Jettie Booze, MD;  Location: [facility];  Service:  Cardiovascular;  Laterality: N/A;  OM2  . CORONARY STENT INTERVENTION N/A 05/14/2017   Procedure: CORONARY STENT INTERVENTION;  Surgeon: Jettie Booze, MD;  Location: [facility];  Service: Cardiovascular;  Laterality: N/A;  . CYST EXCISION  1999   "took off my spine"  . LEFT HEART CATH AND CORONARY ANGIOGRAPHY N/A 05/14/2017   Procedure:  LEFT HEART CATH AND CORONARY ANGIOGRAPHY;  Surgeon: Jettie Booze, MD;  Location: [facility];  Service: Cardiovascular;  Laterality: N/A;  . VAGINAL HYSTERECTOMY  1982    Prior to Admission medications   Medication Sig Start Date End Date Taking? Authorizing Provider  amLODipine (NORVASC) 10 MG tablet Take 10 mg by mouth daily.    Yes [provider]  Ascorbic Acid (VITAMIN C WITH ROSE HIPS) 500 MG tablet Take 500 mg by mouth daily.   Yes [provider]  aspirin EC 81 MG tablet Take 81 mg by mouth daily.   Yes [provider]  calcium carbonate (TUMS - DOSED IN MG ELEMENTAL CALCIUM) 500 MG chewable tablet Chew 2 tablets by mouth 2 (two) times daily as needed for indigestion or heartburn.   Yes [provider]  Calcium Citrate-Vitamin D (CALCIUM CITRATE+D3 PETITES PO) Take 1 tablet by mouth in the morning and at bedtime.    Yes [provider]  cholecalciferol (VITAMIN D) 1000 units tablet Take 1,000 Units by mouth daily.   Yes [provider]  docusate sodium (COLACE) 100 MG capsule Take 200 mg by mouth at bedtime.   Yes [provider]  doxycycline (PERIOSTAT) 20 MG tablet Take 20 mg by mouth 2 (two) times daily.   Yes [provider]  enalapril (VASOTEC) 20 MG tablet Take 20 mg by mouth daily.   Yes [provider]  HYDROcodone-acetaminophen (NORCO/VICODIN) 5-325 MG tablet Take 1 tablet by mouth 2 (two) times daily.    Yes [provider]  ketoconazole (NIZORAL) 2 % shampoo Apply 1 application topically 3 (three) times a week.  05/09/19  Yes [provider]  loratadine (CLARITIN) 10 MG tablet Take 10 mg by mouth daily. Only takes in Summer   Yes [provider]  metoprolol succinate (TOPROL-XL) 25 MG 24 hr tablet Take 25 mg by mouth daily.   Yes [provider]  montelukast (SINGULAIR) 10 MG tablet Take 10 mg by mouth at bedtime.   Yes [provider]   Multiple Vitamins-Minerals (MULTIVITAMIN WITH MINERALS) tablet Take 1 tablet by mouth daily.    Yes [provider]  naproxen (NAPROSYN) 500 MG tablet Take 500 mg by mouth 2 (two) times daily with a meal.  11/28/18  Yes [provider]  Omega-3 Krill Oil 500 MG CAPS Take 500 mg by mouth daily.    Yes [provider]  rosuvastatin (CRESTOR) 20 MG tablet Take 20 mg by mouth See admin instructions. Three times a week on Tuesday, Wednesday and Saturday 11/30/18  Yes [provider]  Zinc 50 MG TABS Take 50 mg by mouth daily.   Yes [provider]  nitroGLYCERIN (NITROSTAT) 0.4 MG SL tablet DISSOLVE ONE TABLET UNDER TONGUE EVERY 5 MINUTES AS NEEDED UP TO 3 DOSES. IF NORELIEF CALL 911. Patient taking differently: Place 0.4 mg under the tongue every 5 (five) minutes as needed for chest pain.  03/17/19   Park Liter, MD    Scheduled Meds: . Chlorhexidine Gluconate Cloth  6 each Topical Daily   Continuous Infusions: . sodium chloride  125 mL/hr at 06/05/19 0701   PRN Meds:.acetaminophen **OR** acetaminophen, hydrALAZINE, ondansetron **OR** ondansetron (ZOFRAN) IV  Allergies as of 06/04/2019 - Review Complete 06/04/2019  Allergen Reaction Noted  . Codeine Nausea And Vomiting 05/13/2017  . Fosamax [alendronate sodium] Nausea And Vomiting 05/14/2017  . Simvastatin  05/14/2017    Family History  Family history unknown: Yes    Social History   Socioeconomic History  . Marital status: Widowed    Spouse name: Not on file  . Number of children: Not on file  . Years of education: Not on file  . Highest education level: Not on file  Occupational History  . Not on file  Tobacco Use  . Smoking status: Never Smoker  . Smokeless tobacco: Never Used  Substance and Sexual Activity  . Alcohol use: No  . Drug use: No  . Sexual activity: Never    Birth control/protection: Surgical  Other Topics Concern  . Not on file  Social History Narrative  .  Not on file   Social Determinants of Health   Financial Resource Strain:   . Difficulty of Paying Living Expenses: Not on file  Food Insecurity:   . Worried About Charity fundraiser in the Last Year: Not on file  . Ran Out of Food in the Last Year: Not on file  Transportation Needs:   . Lack of Transportation (Medical): Not on file  . Lack of Transportation (Non-Medical): Not on file  Physical Activity:   . Days of Exercise per Week: Not on file  . Minutes of Exercise per Session: Not on file  Stress:   . Feeling of Stress : Not on file  Social Connections:   . Frequency of Communication with Friends and Family: Not on file  . Frequency of Social Gatherings with Friends and Family: Not on file  . Attends Religious Services: Not on file  . Active Member of Clubs or Organizations: Not on file  . Attends Archivist Meetings: Not on file  . Marital Status: Not on file  Intimate Partner Violence:   . Fear of Current or Ex-Partner: Not on file  . Emotionally Abused: Not on file  . Physically Abused: Not on file  . Sexually Abused: Not on file    Review of Systems: Review of Systems  Constitutional: Positive for malaise/fatigue. Negative for chills and fever.  HENT: Positive for hearing loss. Negative for tinnitus.   Eyes: Negative for blurred vision and double vision.  Respiratory: Negative for cough and hemoptysis.   Cardiovascular: Negative for chest pain and palpitations.  Gastrointestinal: Positive for blood in stool. Negative for abdominal pain, diarrhea, heartburn, nausea and vomiting.  Genitourinary: Negative for dysuria and urgency.  Musculoskeletal: Negative for myalgias and neck pain.  Skin: Negative for itching and rash.  Neurological: Negative for seizures and loss of consciousness.  Endo/Heme/Allergies: Does not bruise/bleed easily.  Psychiatric/Behavioral: Negative for substance abuse. The patient is nervous/anxious.     Physical Exam: Vital  signs: Vitals:   06/05/19 0515 06/05/19 0600  BP: (!) 117/47 (!) 117/43  Pulse:  81  Resp:  (!) 25  Temp:    SpO2:  97%   Last BM Date: 06/04/19 Physical Exam  Constitutional: She is oriented to person, place, and time. She appears well-nourished. No distress.  Elderly  HENT:  Head: Normocephalic and atraumatic.  Mouth/Throat: No oropharyngeal exudate.  Eyes: EOM are normal. No scleral icterus.  Cardiovascular: Normal rate and normal heart sounds.  Pulmonary/Chest: Effort  normal. No respiratory distress.  Abdominal: Soft. Bowel sounds are normal. She exhibits no distension. There is no abdominal tenderness. There is no rebound and no guarding.  Musculoskeletal:        General: No edema. Normal range of motion.     Cervical back: Normal range of motion and neck supple.  Neurological: She is alert and oriented to person, place, and time.  Skin: Skin is warm. No erythema.  Psychiatric: She has a normal mood and affect. Judgment and thought content normal.    GI:  Lab Results: Recent Labs    06/04/19 2303 06/05/19 0203 06/05/19 0634  WBC 9.3 8.0 6.9  HGB 13.1 11.6* 11.3*  HCT 41.4 36.6 35.7*  PLT 224 206 189   BMET Recent Labs    06/04/19 1606 06/05/19 0203  NA 133* 140  K 3.5 3.8  CL 99 107  CO2 24 24  GLUCOSE 122* 112*  BUN 18 15  CREATININE 0.83 0.74  CALCIUM 10.4* 9.1   LFT Recent Labs    06/04/19 1606  PROT 7.6  ALBUMIN 4.5  AST 24  ALT 17  ALKPHOS 94  BILITOT 0.5   PT/INR No results for input(s): LABPROT, INR in the last 72 hours.   Studies/Results: CT ABDOMEN PELVIS W CONTRAST  Result Date: 06/04/2019 CLINICAL DATA:  79 year old female with history of abdominal distension. EXAM: CT ABDOMEN AND PELVIS WITH CONTRAST TECHNIQUE: Multidetector CT imaging of the abdomen and pelvis was performed using the standard protocol following bolus administration of intravenous contrast. CONTRAST:  163mL OMNIPAQUE IOHEXOL 300 MG/ML  SOLN COMPARISON:  No  priors. FINDINGS: Lower chest: Aortic atherosclerosis. Atherosclerotic calcifications in the left anterior descending, left circumflex and right coronary arteries. Calcifications of the aortic valve. Small hiatal hernia. Hepatobiliary: No suspicious cystic or solid hepatic lesions. No intra or extrahepatic biliary ductal dilatation. Several noncalcified gallstones are noted within the gallbladder measuring up to 2.7 cm. No findings to suggest an acute cholecystitis at this time. Pancreas: No pancreatic mass. No pancreatic ductal dilatation. No pancreatic or peripancreatic fluid collections or inflammatory changes. Spleen: Unremarkable. Adrenals/Urinary Tract: Subcentimeter low-attenuation lesions in both kidneys, too small to characterize, but statistically likely to represent tiny cysts. No hydroureteronephrosis. Urinary bladder is normal in appearance. Bilateral adrenal glands are normal in appearance. Stomach/Bowel: Normal appearance of the stomach. No pathologic dilatation of small bowel or colon. A few scattered colonic diverticulae are noted, without surrounding inflammatory changes to suggest an acute diverticulitis at this time. Normal appendix. Vascular/Lymphatic: Aortic atherosclerosis, without evidence of aneurysm or dissection in the abdominal or pelvic vasculature. No lymphadenopathy noted in the abdomen or pelvis. Reproductive: Status post hysterectomy. Ovaries are not confidently identified may be surgically absent or atrophic. Other: No significant volume of ascites.  No pneumoperitoneum. Musculoskeletal: There are no aggressive appearing lytic or blastic lesions noted in the visualized portions of the skeleton. IMPRESSION: 1. No acute findings are noted in the abdomen or pelvis to account for the patient's symptoms. 2. Cholelithiasis without evidence of acute cholecystitis at this time. 3. Colonic diverticulosis without evidence of acute diverticulitis at this time. 4. Aortic atherosclerosis, in  addition to least 3 vessel coronary artery disease. Assessment for potential risk factor modification, dietary therapy or pharmacologic therapy may be warranted, if clinically indicated. 5. There are calcifications of the aortic valve. Echocardiographic correlation for evaluation of potential valvular dysfunction may be warranted if clinically indicated. 6. Small hiatal hernia. 7. Additional incidental findings, as above. Electronically Signed   By:  Vinnie Langton M.D.   On: 06/04/2019 18:03    Impression/Plan: -Painless hematochezia.  Most likely diverticular bleeding.  CT abdomen pelvis with contrast showed diverticulosis without any acute GI changes -Acute blood loss anemia -History of coronary artery disease.  On aspirin  Recommendations ------------------------- -Patient initially declined colonoscopy but she is willing to do it now.  Other options such as GI bleeding scan also discussed with the patient.   -Plan for colonoscopy tomorrow.  Okay to have clear liquids diet today.  N.p.o. past midnight. -Monitor H&H.   Risks (bleeding, infection, bowel perforation that could require surgery, sedation-related changes in cardiopulmonary systems), benefits (identification and possible treatment of source of symptoms, exclusion of certain causes of symptoms), and alternatives (watchful waiting, radiographic imaging studies, empiric medical treatment)  were explained to patient/family in detail and patient wishes to proceed.    LOS: 0 days   Otis Brace  MD, FACP 06/05/2019, 8:11 AM  Contact #  [phone]

## 2019-06-05 NOTE — Progress Notes (Signed)
[facility]  PROGRESS NOTE  [name] [id number] DOB: 09/30/1940 DOA: 06/04/2019 PCP: Manfred Shirts, PA   Brief HPI:   79 year old female with a history of CAD s/p stenting in February 2019, history of stroke with PFO on aspirin came to ED with multiple episodes of rectal bleeding since yesterday afternoon.  Denied abdominal pain, nausea, vomiting, fever or chills. CT abdomen pelvis showed colonic diverticulosis without acute abnormality. Her hemoglobin has remained stable Gastroenterology was consulted.    Subjective   Patient seen and examined, had bloody bowel movement this morning.  Denies any other symptoms.  No abdominal pain.  Vitals have been stable.   Assessment/Plan:     1. Acute lower GI bleed-secondary to diverticulosis.  Aspirin is currently on hold.  Gastroenterology has been consulted.  Hemoglobin has remained stable.  Hemodynamically patient is stable.  Will follow gastroenterology recommendations. 2. Hypertension-continue as needed IV hydralazine.  Currently is n.p.o. 3. CAD s/p stenting-patient had coronary stenting in February 2019.  Denies any chest pain.  Aspirin is currently on hold. 4. History of stroke/PFO-stable, aspirin on hold.    SpO2: 97 %   COVID-19 Labs  No results for input(s): DDIMER, FERRITIN, LDH, CRP in the last 72 hours.  Lab Results  Component Value Date   [place] NEGATIVE 06/04/2019     CBG: No results for input(s): GLUCAP in the last 168 hours.  CBC: Recent Labs  Lab 06/04/19 1606 06/04/19 2033 06/04/19 2303 06/05/19 0203 06/05/19 0634  WBC 12.1* 9.2 9.3 8.0 6.9  NEUTROABS 9.4*  --   --   --   --   HGB 15.2* 13.2 13.1 11.6* 11.3*  HCT 47.1* 41.7 41.4 36.6 35.7*  MCV 90.9 91.6 91.4 92.9 92.5  PLT 242 213 224 206 [id number]    Basic Metabolic Panel: Recent Labs  Lab 06/04/19 1606 06/05/19 0203  NA 133* 140  K 3.5 3.8  CL 99 107  CO2 24 24  GLUCOSE 122* 112*  BUN 18 15  CREATININE 0.83 0.74  CALCIUM  10.4* 9.1     Liver Function Tests: Recent Labs  Lab 06/04/19 1606  AST 24  ALT 17  ALKPHOS 59  BILITOT 0.5  PROT 7.6  ALBUMIN 4.5        DVT prophylaxis: SCDs  Code Status: Full code  Family Communication: No family at bedside  Disposition Plan: Patient admitted with rectal bleeding likely from diverticulosis.  Gastroenterology has been consulted, likely home when rectal bleeding stops and hemoglobin remained stable.         Scheduled medications:  . Chlorhexidine Gluconate Cloth  6 each Topical Daily    Consultants:  Gastroenterology  Procedures:  None  Antibiotics:   Anti-infectives (From admission, onward)   None       Objective   Vitals:   06/05/19 0200 06/05/19 0515 06/05/19 0600 06/05/19 0800  BP: 110/62 (!) 117/47 (!) 117/43   Pulse: 78  81   Resp: 17  (!) 25   Temp:    97.6 F (36.4 C)  TempSrc:    Oral  SpO2: 99%  97%   Weight:      Height:        Intake/Output Summary (Last 24 hours) at 06/05/2019 0917 Last data filed at 06/05/2019 0600 Gross per 24 hour  Intake 855.83 ml  Output --  Net 855.83 ml    02/20 1901 - 02/22 0700 In: 855.8 [I.V.:855.8] Out: -   Filed Weights   06/04/19 1514  Weight: 81.6 kg    Physical Examination:    General: Appears in no acute distress  Cardiovascular: S1-S2, regular, no murmur auscultated  Respiratory: Clear to auscultation bilaterally, no wheezing or crackles auscultated  Abdomen: Abdomen is soft, nontender, no organomegaly  Extremities: No edema in the lower extremities  Neurologic: Alert, oriented x3, intact insight and judgment    Data Reviewed:   Recent Results (from the past 240 hour(s))  SARS CORONAVIRUS 2 (TAT 6-24 HRS) Nasopharyngeal Urine, Clean Catch     Status: None   Collection Time: 06/04/19  4:10 PM   Specimen: Urine, Clean Catch; Nasopharyngeal  Result Value Ref Range Status   SARS Coronavirus 2 NEGATIVE NEGATIVE Final    Comment: (NOTE) SARS-CoV-2  target nucleic acids are NOT DETECTED. The SARS-CoV-2 RNA is generally detectable in upper and lower respiratory specimens during the acute phase of infection. Negative results do not preclude SARS-CoV-2 infection, do not rule out co-infections with other pathogens, and should not be used as the sole basis for treatment or other patient management decisions. Negative results must be combined with clinical observations, patient history, and epidemiological information. The expected result is Negative. Fact Sheet for Patients: SugarRoll.be Fact Sheet for Healthcare Providers: https://www.woods-mathews.com/ This test is not yet approved or cleared by the [place] FDA and  has been authorized for detection and/or diagnosis of SARS-CoV-2 by FDA under an Emergency Use Authorization (EUA). This EUA will remain  in effect (meaning this test can be used) for the duration of the COVID-19 declaration under Section 56 4(b)(1) of the Act, 21 U.S.C. section 360bbb-3(b)(1), unless the authorization is terminated or revoked sooner. Performed at [facility], [place] [address] [place], [place] [address]   MRSA PCR Screening     Status: None   Collection Time: 06/04/19  9:34 PM   Specimen: Nasopharyngeal  Result Value Ref Range Status   MRSA by PCR NEGATIVE NEGATIVE Final    Comment:        The GeneXpert MRSA Assay (FDA approved for NASAL specimens only), is one component of a comprehensive MRSA colonization surveillance program. It is not intended to diagnose MRSA infection nor to guide or monitor treatment for MRSA infections. Performed at [facility], [address] [place],  [address]     Recent Labs  Lab 06/04/19 1606  LIPASE 20   No results for input(s): AMMONIA in the last 168 hours.  Cardiac Enzymes: No results for input(s): CKTOTAL, CKMB, CKMBINDEX, TROPONINI in the last 168 hours. BNP (last 3  results) No results for input(s): BNP in the last 8760 hours.  ProBNP (last 3 results) No results for input(s): PROBNP in the last 8760 hours.  Studies:  CT ABDOMEN PELVIS W CONTRAST  Result Date: 06/04/2019 CLINICAL DATA:  79 year old female with history of abdominal distension. EXAM: CT ABDOMEN AND PELVIS WITH CONTRAST TECHNIQUE: Multidetector CT imaging of the abdomen and pelvis was performed using the standard protocol following bolus administration of intravenous contrast. CONTRAST:  130mL OMNIPAQUE IOHEXOL 300 MG/ML  SOLN COMPARISON:  No priors. FINDINGS: Lower chest: Aortic atherosclerosis. Atherosclerotic calcifications in the left anterior descending, left circumflex and right coronary arteries. Calcifications of the aortic valve. Small hiatal hernia. Hepatobiliary: No suspicious cystic or solid hepatic lesions. No intra or extrahepatic biliary ductal dilatation. Several noncalcified gallstones are noted within the gallbladder measuring up to 2.7 cm. No findings to suggest an acute cholecystitis at this time. Pancreas: No pancreatic mass. No pancreatic ductal dilatation. No  pancreatic or peripancreatic fluid collections or inflammatory changes. Spleen: Unremarkable. Adrenals/Urinary Tract: Subcentimeter low-attenuation lesions in both kidneys, too small to characterize, but statistically likely to represent tiny cysts. No hydroureteronephrosis. Urinary bladder is normal in appearance. Bilateral adrenal glands are normal in appearance. Stomach/Bowel: Normal appearance of the stomach. No pathologic dilatation of small bowel or colon. A few scattered colonic diverticulae are noted, without surrounding inflammatory changes to suggest an acute diverticulitis at this time. Normal appendix. Vascular/Lymphatic: Aortic atherosclerosis, without evidence of aneurysm or dissection in the abdominal or pelvic vasculature. No lymphadenopathy noted in the abdomen or pelvis. Reproductive: Status post hysterectomy.  Ovaries are not confidently identified may be surgically absent or atrophic. Other: No significant volume of ascites.  No pneumoperitoneum. Musculoskeletal: There are no aggressive appearing lytic or blastic lesions noted in the visualized portions of the skeleton. IMPRESSION: 1. No acute findings are noted in the abdomen or pelvis to account for the patient's symptoms. 2. Cholelithiasis without evidence of acute cholecystitis at this time. 3. Colonic diverticulosis without evidence of acute diverticulitis at this time. 4. Aortic atherosclerosis, in addition to least 3 vessel coronary artery disease. Assessment for potential risk factor modification, dietary therapy or pharmacologic therapy may be warranted, if clinically indicated. 5. There are calcifications of the aortic valve. Echocardiographic correlation for evaluation of potential valvular dysfunction may be warranted if clinically indicated. 6. Small hiatal hernia. 7. Additional incidental findings, as above. Electronically Signed   By: Vinnie Langton M.D.   On: 06/04/2019 18:03       [place]   [facility] If 7PM-7AM, please contact night-coverage at [email or web address], Office  [phone]   06/05/2019, 9:17 AM  LOS: 0 days

## 2019-06-05 NOTE — Progress Notes (Signed)
Called Dr. Leanna Sato office and notified that pt now requesting colonoscopy that she previously denied.

## 2019-06-05 NOTE — Plan of Care (Signed)
  Problem: Activity: Goal: Risk for activity intolerance will decrease Outcome: Progressing   Problem: Nutrition: Goal: Adequate nutrition will be maintained Outcome: Progressing   

## 2019-06-05 NOTE — Progress Notes (Signed)
Pt handling bowel prep well.  Decreased amount of stool.  No blood seen since first stool after starting prep.

## 2019-06-06 ENCOUNTER — Encounter (HOSPITAL_COMMUNITY)
Admission: EM | Disposition: A | Discharge status: Home / Self Care | Payer: Self-pay | Source: Home / Self Care | Attending: Emergency Medicine

## 2019-06-06 ENCOUNTER — Observation Stay (HOSPITAL_COMMUNITY): Payer: PPO | Admitting: Anesthesiology

## 2019-06-06 ENCOUNTER — Encounter (HOSPITAL_COMMUNITY): Payer: Self-pay | Admitting: Internal Medicine

## 2019-06-06 DIAGNOSIS — K648 Other hemorrhoids: Secondary | ICD-10-CM | POA: Diagnosis not present

## 2019-06-06 DIAGNOSIS — I083 Combined rheumatic disorders of mitral, aortic and tricuspid valves: Secondary | ICD-10-CM | POA: Diagnosis not present

## 2019-06-06 DIAGNOSIS — I252 Old myocardial infarction: Secondary | ICD-10-CM | POA: Diagnosis not present

## 2019-06-06 DIAGNOSIS — Q211 Atrial septal defect: Secondary | ICD-10-CM | POA: Diagnosis not present

## 2019-06-06 DIAGNOSIS — I10 Essential (primary) hypertension: Secondary | ICD-10-CM | POA: Diagnosis not present

## 2019-06-06 DIAGNOSIS — D62 Acute posthemorrhagic anemia: Secondary | ICD-10-CM | POA: Diagnosis not present

## 2019-06-06 DIAGNOSIS — D120 Benign neoplasm of cecum: Secondary | ICD-10-CM | POA: Diagnosis not present

## 2019-06-06 DIAGNOSIS — K625 Hemorrhage of anus and rectum: Secondary | ICD-10-CM | POA: Diagnosis not present

## 2019-06-06 DIAGNOSIS — Z20822 Contact with and (suspected) exposure to covid-19: Secondary | ICD-10-CM | POA: Diagnosis not present

## 2019-06-06 DIAGNOSIS — K922 Gastrointestinal hemorrhage, unspecified: Secondary | ICD-10-CM | POA: Diagnosis not present

## 2019-06-06 DIAGNOSIS — D127 Benign neoplasm of rectosigmoid junction: Secondary | ICD-10-CM | POA: Diagnosis not present

## 2019-06-06 DIAGNOSIS — I739 Peripheral vascular disease, unspecified: Secondary | ICD-10-CM | POA: Diagnosis not present

## 2019-06-06 DIAGNOSIS — K5730 Diverticulosis of large intestine without perforation or abscess without bleeding: Secondary | ICD-10-CM | POA: Diagnosis not present

## 2019-06-06 DIAGNOSIS — I2510 Atherosclerotic heart disease of native coronary artery without angina pectoris: Secondary | ICD-10-CM | POA: Diagnosis not present

## 2019-06-06 DIAGNOSIS — D649 Anemia, unspecified: Secondary | ICD-10-CM | POA: Diagnosis not present

## 2019-06-06 DIAGNOSIS — K621 Rectal polyp: Secondary | ICD-10-CM | POA: Diagnosis not present

## 2019-06-06 DIAGNOSIS — K635 Polyp of colon: Secondary | ICD-10-CM | POA: Diagnosis not present

## 2019-06-06 HISTORY — PX: BIOPSY: SHX5522

## 2019-06-06 HISTORY — PX: POLYPECTOMY: SHX5525

## 2019-06-06 HISTORY — PX: COLONOSCOPY WITH PROPOFOL: SHX5780

## 2019-06-06 LAB — BASIC METABOLIC PANEL
Anion gap: 8 (ref 5–15)
BUN: 8 mg/dL (ref 8–23)
CO2: 20 mmol/L — ABNORMAL LOW (ref 22–32)
Calcium: 8.6 mg/dL — ABNORMAL LOW (ref 8.9–10.3)
Chloride: 111 mmol/L (ref 98–111)
Creatinine, Ser: 0.5 mg/dL (ref 0.44–1.00)
GFR calc Af Amer: >60 mL/min (ref >60)
GFR calc non Af Amer: >60 mL/min (ref >60)
Glucose, Bld: 101 mg/dL — ABNORMAL HIGH (ref 70–99)
Potassium: 3.4 mmol/L — ABNORMAL LOW (ref 3.5–5.1)
Sodium: 139 mmol/L (ref 135–145)

## 2019-06-06 LAB — CBC
HCT: 34.1 % — ABNORMAL LOW (ref 36.0–46.0)
Hemoglobin: 10.8 g/dL — ABNORMAL LOW (ref 12.0–15.0)
MCH: 29.3 pg (ref 26.0–34.0)
MCHC: 31.7 g/dL (ref 30.0–36.0)
MCV: 92.4 fL (ref 80.0–100.0)
Platelets: 175 10*3/uL (ref 150–400)
RBC: 3.69 MIL/uL — ABNORMAL LOW (ref 3.87–5.11)
RDW: 14.9 % (ref 11.5–15.5)
WBC: 6.9 10*3/uL (ref 4.0–10.5)
nRBC: 0 % (ref 0.0–0.2)

## 2019-06-06 SURGERY — COLONOSCOPY WITH PROPOFOL
Anesthesia: Monitor Anesthesia Care

## 2019-06-06 MED ORDER — LIDOCAINE HCL (CARDIAC) PF 100 MG/5ML IV SOSY
PREFILLED_SYRINGE | INTRAVENOUS | Status: DC | PRN
  Administered 2019-06-06: 80 mg via INTRAVENOUS

## 2019-06-06 MED ORDER — PHENYLEPHRINE HCL (PRESSORS) 10 MG/ML IV SOLN
INTRAVENOUS | Status: DC | PRN
  Administered 2019-06-06: 40 ug via INTRAVENOUS

## 2019-06-06 MED ORDER — PROPOFOL 500 MG/50ML IV EMUL
INTRAVENOUS | Status: DC | PRN
  Administered 2019-06-06: 30 mg via INTRAVENOUS
  Administered 2019-06-06: 10 mg via INTRAVENOUS
  Administered 2019-06-06: 100 ug/kg/min via INTRAVENOUS

## 2019-06-06 MED ORDER — AMLODIPINE BESYLATE 10 MG PO TABS
10.0000 mg | ORAL_TABLET | Freq: Every day | ORAL | Status: DC
  Administered 2019-06-06: 09:00:00 10 mg via ORAL
  Filled 2019-06-06: qty 1

## 2019-06-06 MED ORDER — POTASSIUM CHLORIDE CRYS ER 20 MEQ PO TBCR
40.0000 meq | EXTENDED_RELEASE_TABLET | Freq: Once | ORAL | Status: AC
  Administered 2019-06-06: 14:00:00 40 meq via ORAL
  Filled 2019-06-06: qty 2

## 2019-06-06 MED ORDER — LACTATED RINGERS IV SOLN
INTRAVENOUS | Status: DC
  Administered 2019-06-06: 1000 mL via INTRAVENOUS

## 2019-06-06 MED ORDER — EPHEDRINE SULFATE 50 MG/ML IJ SOLN
INTRAMUSCULAR | Status: DC | PRN
  Administered 2019-06-06 (×2): 10 mg via INTRAVENOUS
  Administered 2019-06-06 (×2): 5 mg via INTRAVENOUS

## 2019-06-06 MED ORDER — ENALAPRIL MALEATE 10 MG PO TABS
20.0000 mg | ORAL_TABLET | Freq: Every day | ORAL | Status: DC
  Administered 2019-06-06: 10:00:00 20 mg via ORAL
  Filled 2019-06-06: qty 2

## 2019-06-06 MED ORDER — SODIUM CHLORIDE 0.9 % IV SOLN: INTRAVENOUS | Status: DC

## 2019-06-06 MED ORDER — PROPOFOL 500 MG/50ML IV EMUL
INTRAVENOUS | Status: AC
  Filled 2019-06-06: qty 50

## 2019-06-06 SURGICAL SUPPLY — 22 items

## 2019-06-06 NOTE — Transfer of Care (Signed)
Immediate Anesthesia Transfer of Care Note  Patient: Theresa Prince  Procedure(s) Performed: Procedure(s): COLONOSCOPY WITH PROPOFOL (N/A) BIOPSY POLYPECTOMY  Patient Location: PACU  Anesthesia Type:MAC  Level of Consciousness:  sedated, patient cooperative and responds to stimulation  Airway & Oxygen Therapy:Patient Spontanous Breathing and Patient connected to face mask oxgen  Post-op Assessment:  Report given to PACU RN and Post -op Vital signs reviewed and stable  Post vital signs:  Reviewed and stable  Last Vitals:  Vitals:   06/06/19 0900 06/06/19 1010  BP: (!) 172/73 (!) 162/56  Pulse: 87 83  Resp: 19 (!) 22  Temp:  36.8 C  SpO2: 88% 91%    Complications: No apparent anesthesia complications

## 2019-06-06 NOTE — Progress Notes (Signed)
D/c paper work reviewed with patient and daughter. Print outs given on heart healthy diet and foods to avoid. All questions answered, all belongings accounted for per patient. Transported patient to front entrance.

## 2019-06-06 NOTE — Anesthesia Postprocedure Evaluation (Signed)
Anesthesia Post Note  Patient: Theresa Prince  Procedure(s) Performed: COLONOSCOPY WITH PROPOFOL (N/A ) BIOPSY POLYPECTOMY     Patient location during evaluation: PACU Anesthesia Type: MAC Level of consciousness: awake and alert Pain management: pain level controlled Vital Signs Assessment: post-procedure vital signs reviewed and stable Respiratory status: spontaneous breathing, nonlabored ventilation and respiratory function stable Cardiovascular status: blood pressure returned to baseline and stable Postop Assessment: no apparent nausea or vomiting Anesthetic complications: no    Last Vitals:  Vitals:   06/06/19 1010 06/06/19 1123  BP: (!) 162/56 (!) 113/43  Pulse: 83   Resp: (!) 22   Temp: 36.8 C (!) 36.4 C  SpO2: 97%     Last Pain:  Vitals:   06/06/19 1123  TempSrc: Oral  PainSc:                  Pervis Hocking

## 2019-06-06 NOTE — Plan of Care (Signed)
  Problem: Education: Goal: Knowledge of General Education information will improve Description: Including pain rating scale, medication(s)/side effects and non-pharmacologic comfort measures 06/06/2019 1342 by Jeanell Sparrow, RN Outcome: Adequate for Discharge 06/06/2019 1029 by Jeanell Sparrow, RN Outcome: Progressing   Problem: Health Behavior/Discharge Planning: Goal: Ability to manage health-related needs will improve 06/06/2019 1342 by Jeanell Sparrow, RN Outcome: Adequate for Discharge 06/06/2019 1029 by Jeanell Sparrow, RN Outcome: Progressing   Problem: Clinical Measurements: Goal: Ability to maintain clinical measurements within normal limits will improve 06/06/2019 1342 by Jeanell Sparrow, RN Outcome: Adequate for Discharge 06/06/2019 1029 by Jeanell Sparrow, RN Outcome: Progressing Goal: Will remain free from infection 06/06/2019 1342 by Jeanell Sparrow, RN Outcome: Adequate for Discharge 06/06/2019 1029 by Jeanell Sparrow, RN Outcome: Progressing Goal: Diagnostic test results will improve 06/06/2019 1342 by Jeanell Sparrow, RN Outcome: Adequate for Discharge 06/06/2019 1029 by Jeanell Sparrow, RN Outcome: Progressing Goal: Respiratory complications will improve 06/06/2019 1342 by Jeanell Sparrow, RN Outcome: Adequate for Discharge 06/06/2019 1029 by Jeanell Sparrow, RN Outcome: Progressing Goal: Cardiovascular complication will be avoided 06/06/2019 1342 by Jeanell Sparrow, RN Outcome: Adequate for Discharge 06/06/2019 1029 by Jeanell Sparrow, RN Outcome: Progressing   Problem: Activity: Goal: Risk for activity intolerance will decrease 06/06/2019 1342 by Jeanell Sparrow, RN Outcome: Adequate for Discharge 06/06/2019 1029 by Jeanell Sparrow, RN Outcome: Progressing   Problem: Nutrition: Goal: Adequate nutrition will be maintained 06/06/2019 1342 by Jeanell Sparrow, RN Outcome: Adequate for Discharge 06/06/2019 1029 by Jeanell Sparrow, RN Outcome: Progressing    Problem: Coping: Goal: Level of anxiety will decrease 06/06/2019 1342 by Jeanell Sparrow, RN Outcome: Adequate for Discharge 06/06/2019 1029 by Jeanell Sparrow, RN Outcome: Progressing   Problem: Elimination: Goal: Will not experience complications related to bowel motility 06/06/2019 1342 by Jeanell Sparrow, RN Outcome: Adequate for Discharge 06/06/2019 1029 by Jeanell Sparrow, RN Outcome: Progressing Goal: Will not experience complications related to urinary retention 06/06/2019 1342 by Jeanell Sparrow, RN Outcome: Adequate for Discharge 06/06/2019 1029 by Jeanell Sparrow, RN Outcome: Progressing   Problem: Pain Managment: Goal: General experience of comfort will improve 06/06/2019 1342 by Jeanell Sparrow, RN Outcome: Adequate for Discharge 06/06/2019 1029 by Jeanell Sparrow, RN Outcome: Progressing   Problem: Safety: Goal: Ability to remain free from injury will improve 06/06/2019 1342 by Jeanell Sparrow, RN Outcome: Adequate for Discharge 06/06/2019 1029 by Jeanell Sparrow, RN Outcome: Progressing   Problem: Skin Integrity: Goal: Risk for impaired skin integrity will decrease 06/06/2019 1342 by Jeanell Sparrow, RN Outcome: Adequate for Discharge 06/06/2019 1029 by Jeanell Sparrow, RN Outcome: Progressing

## 2019-06-06 NOTE — Plan of Care (Signed)

## 2019-06-06 NOTE — Discharge Instructions (Signed)
Gastrointestinal Bleeding Gastrointestinal (GI) bleeding is bleeding somewhere along the path that food travels through the body (digestive tract). This path is anywhere between the mouth and the opening of the butt (anus). You may have blood in your poop (stool) or have black poop. If you throw up (vomit), there may be blood in it. This condition can be mild, serious, or even life-threatening. If you have a lot of bleeding, you may need to stay in the hospital. What are the causes? This condition may be caused by:  Irritation and swelling of the esophagus (esophagitis). The esophagus is part of the body that moves food from your mouth to your stomach.  Swollen veins in the butt (hemorrhoids).  Areas of painful tearing in the opening of the butt (anal fissures). These are often caused by passing hard poop.  Pouches that form on the colon over time (diverticulosis).  Irritation and swelling (diverticulitis) in areas where pouches have formed on the colon.  Growths (polyps) or cancer. Colon cancer often starts out as growths that are not cancer.  Irritation of the stomach lining (gastritis).  Sores (ulcers) in the stomach. What increases the risk? You are more likely to develop this condition if you:  Have a certain type of infection in your stomach (Helicobacter pylori infection).  Take certain medicines.  Smoke.  Drink alcohol. What are the signs or symptoms? Common symptoms of this condition include:  Throwing up (vomiting) material that has bright red blood in it. It may look like coffee grounds.  Changes in your poop. The poop may: ? Have red blood in it. ? Be black, look like tar, and smell stronger than normal. ? Be red.  Pain or cramping in the belly (abdomen). How is this treated? Treatment for this condition depends on the cause of the bleeding. For example:  Sometimes, the bleeding can be stopped during a procedure that is done to find the problem (endoscopy or  colonoscopy).  Medicines can be used to: ? Help control irritation, swelling, or infection. ? Reduce acid in your stomach.  Certain problems can be treated with: ? Creams. ? Medicines that are put in the butt (suppositories). ? Warm baths.  Surgery is sometimes needed.  If you lose a lot of blood, you may need a blood transfusion. If bleeding is mild, you may be allowed to go home. If there is a lot of bleeding, you will need to stay in the hospital. Follow these instructions at home:   Take over-the-counter and prescription medicines only as told by your doctor.  Eat foods that have a lot of fiber in them. These foods include beans, whole grains, and fresh fruits and vegetables. You can also try eating 1-3 prunes each day.  Drink enough fluid to keep your pee (urine) pale yellow.  Keep all follow-up visits as told by your doctor. This is important. Contact a doctor if:  Your symptoms do not get better. Get help right away if:  Your bleeding does not stop.  You feel dizzy or you pass out (faint).  You feel weak.  You have very bad cramps in your back or belly.  You pass large clumps of blood (clots) in your poop.  Your symptoms are getting worse.  You have chest pain or fast heartbeats. Summary  GI bleeding is bleeding somewhere along the path that food travels through the body (digestive tract).  This bleeding can be caused by many things. Treatment depends on the cause of the bleeding.    Take medicines only as told by your doctor.  Keep all follow-up visits as told by your doctor. This is important. This information is not intended to replace advice given to you by your health care provider. Make sure you discuss any questions you have with your health care provider. Document Revised: 11/10/2017 Document Reviewed: 11/10/2017 Elsevier Patient Education  [employer].   Rectal Bleeding  Rectal bleeding is when blood comes out of the opening of the butt  (anus). People with this kind of bleeding may notice bright red blood in their underwear or in the toilet after they poop (have a bowel movement). They may also have dark red or black poop (stool). Rectal bleeding is often a sign that something is wrong. It needs to be checked by a doctor. Follow these instructions at home: Watch for any changes in your condition. Take these actions to help with bleeding and discomfort:  Eat a diet that is high in fiber. This will keep your poop soft so it is easier for you to poop without pushing too hard. Ask your doctor to tell you what foods and drinks are high in fiber.  Drink enough fluid to keep your pee (urine) clear or pale yellow. This also helps keep your poop soft.  Try taking a warm bath. This may help with pain.  Keep all follow-up visits as told by your doctor. This is important. Get help right away if:  You have new bleeding.  You have more bleeding than before.  You have black or dark red poop.  You throw up (vomit) blood or something that looks like coffee grounds.  You have pain or tenderness in your belly (abdomen).  You have a fever.  You feel weak.  You feel sick to your stomach (nauseous).  You pass out (faint).  You have very bad pain in your butt.  You cannot poop. This information is not intended to replace advice given to you by your health care provider. Make sure you discuss any questions you have with your health care provider. Document Revised: 03/12/2017 Document Reviewed: 05/26/2015 Elsevier Patient Education  2020 [employer].

## 2019-06-06 NOTE — Op Note (Addendum)
[facility] Patient Name: Theresa Prince Procedure Date: 06/06/2019 MRN: [id number] Attending MD: Otis Brace , MD Date of Birth: Jul 22, 1940 CSN: [id number] Age: 79 Admit Type: Inpatient Procedure:                Colonoscopy Indications:              Last colonoscopy: date unknown (unable to locate                            last colonoscopy report), Last colonoscopy 10 years                            ago, Hematochezia Providers:                Otis Brace, MD, Glori Bickers, RN, William Dalton, Technician Referring MD:              Medicines:                Sedation Administered by an Anesthesia Professional Complications:            No immediate complications. Estimated Blood Loss:     Estimated blood loss was minimal. Procedure:                Pre-Anesthesia Assessment:                           - Prior to the procedure, a History and Physical                            was performed, and patient medications and                            allergies were reviewed. The patient's tolerance of                            previous anesthesia was also reviewed. The risks                            and benefits of the procedure and the sedation                            options and risks were discussed with the patient.                            All questions were answered, and informed consent                            was obtained. Prior Anticoagulants: The patient has                            taken no previous anticoagulant or antiplatelet                            agents  except for aspirin. ASA Grade Assessment:                            III - A patient with severe systemic disease. After                            reviewing the risks and benefits, the patient was                            deemed in satisfactory condition to undergo the                            procedure.                           After obtaining  informed consent, the colonoscope                            was passed under direct vision. Throughout the                            procedure, the patient's blood pressure, pulse, and                            oxygen saturations were monitored continuously. The                            [id number] Olympus pediatric colonscope                            was introduced through the anus and advanced to the                            the terminal ileum, with identification of the                            appendiceal orifice and IC valve. The colonoscopy                            was performed without difficulty. The patient                            tolerated the procedure well. The quality of the                            bowel preparation was fair. Scope In: 10:50:05 AM Scope Out: 11:09:44 AM Scope Withdrawal Time: 0 hours 12 minutes 48 seconds  Total Procedure Duration: 0 hours 19 minutes 39 seconds  Findings:      The perianal and digital rectal examinations were normal.      The terminal ileum appeared normal.      There is no endoscopic evidence of bleeding in the entire colon.      Two polyps were found in the cecum. The polyps were diminutive in size.  These polyps were removed with a cold biopsy forceps. Resection and       retrieval were complete.      A 8 mm polyp was found in the ileocecal valve. The polyp was sessile.       The polyp was removed with a cold snare. Resection and retrieval were       complete.      A 4 mm polyp was found in the rectum. The polyp was sessile. The polyp       was removed with a cold snare. Resection and retrieval were complete.      Multiple small and large-mouthed diverticula were found in the entire       colon.      Internal hemorrhoids were found during retroflexion. The hemorrhoids       were large.      Anal papilla(e) were hypertrophied. Impression:               - Preparation of the colon was fair.                            - The examined portion of the ileum was normal.                           - Two diminutive polyps in the cecum, removed with                            a cold biopsy forceps. Resected and retrieved.                           - One 8 mm polyp at the ileocecal valve, removed                            with a cold snare. Resected and retrieved.                           - One 4 mm polyp in the rectum, removed with a cold                            snare. Resected and retrieved.                           - Diverticulosis in the entire examined colon.                           - Internal hemorrhoids.                           - Anal papilla(e) were hypertrophied. Moderate Sedation:      Moderate (conscious) sedation was personally administered by an       anesthesia professional. The following parameters were monitored: oxygen       saturation, heart rate, blood pressure, and response to care. Recommendation:           - Return patient to hospital ward for ongoing care.                           -  Cardiac diet.                           - Continue present medications.                           - Await pathology results.                           - No repeat colonoscopy because of age Procedure Code(s):        --- Professional ---                           [phone], Colonoscopy, flexible; with removal of                            tumor(s), polyp(s), or other lesion(s) by snare                            technique                           [id number], 73, Colonoscopy, flexible; with biopsy,                            single or multiple Diagnosis Code(s):        --- Professional ---                           K64.8, Other hemorrhoids                           K63.5, Polyp of colon                           K62.1, Rectal polyp                           K62.89, Other specified diseases of anus and rectum                           K92.1, Melena (includes Hematochezia)                            K57.30, Diverticulosis of large intestine without                            perforation or abscess without bleeding CPT copyright 2019 [employer]. All rights reserved. The codes documented in this report are preliminary and upon coder review may  be revised to meet current compliance requirements. Otis Brace, MD Otis Brace, MD 06/06/2019 11:21:47 AM Number of Addenda: 0

## 2019-06-06 NOTE — Discharge Summary (Signed)
Physician Discharge Summary  Theresa Prince [id number] DOB: 06-25-1940 DOA: 06/04/2019  PCP: Manfred Shirts, PA  Admit date: 06/04/2019 Discharge date: 06/06/2019  Time spent: 50 minutes  Recommendations for Outpatient Follow-up:  1. Follow-up PCP in 2 weeks 2. Patient had colonoscopy with polypectomy.  Follow pathology report as outpatient.   Discharge Diagnoses:  Principal Problem:   Lower GI bleed Active Problems:   CAD in native artery   PFO (patent foramen ovale)   Acute GI bleeding   Discharge Condition: Stable  Diet recommendation: Heart healthy diet  Filed Weights   06/04/19 1514 06/06/19 1010  Weight: 81.6 kg 81.6 kg    History of present illness:  79 year old female with a history of CAD s/p stenting in February 2019, history of stroke with PFO on aspirin came to ED with multiple episodes of rectal bleeding since yesterday afternoon.  Denied abdominal pain, nausea, vomiting, fever or chills. CT abdomen pelvis showed colonic diverticulosis without acute abnormality. Her hemoglobin has remained stable Gastroenterology was consulted.   Hospital Course:   1. Acute lower GI bleed-secondary to diverticulosis.    Gastroenterology was consulted.  Patient underwent colonoscopy today which showed pandiverticulosis.  Few polyps were removed.  Path report can be followed as outpatient.  Restart taking aspirin.  No future colonoscopy recommended due to advanced age.  2. Anemia-due to acute blood loss due to above.    Hemoglobin has remained stable at 10.8.  Patient is hemodynamically stable.   3. Hypokalemia-potassium is 3.4, replace potassium today before discharge. 4. Hypertension-blood pressure has been elevated.  Patient is on Toprol-XL 25 mg daily.  Will restart her home medications including enalapril 20 mg daily, amlodipine 10 mg p.o. daily.  Patient had allergic reaction to hydralazine this morning, when she became dizzy after she received IV hydralazine. 5. CAD s/p  stenting-patient had coronary stenting in February 2019.  Denies any chest pain.    Continue aspirin. 6. History of stroke/PFO-stable, continue aspirin.  Procedures:  Colonoscopy-showed pandiverticulosis, few polyps which were removed.  Path report to be followed as outpatient.  Consultations:  Gastroenterology  Discharge Exam: Vitals:   06/06/19 1215 06/06/19 1300  BP:  (!) 113/52  Pulse:  80  Resp:  17  Temp: (!) 97.5 F (36.4 C)   SpO2:  100%     Discharge Instructions   Discharge Instructions    Diet - low sodium heart healthy   Complete by: As directed    Increase activity slowly   Complete by: As directed      Allergies as of 06/06/2019      Reactions   Codeine Nausea And Vomiting   Fosamax [alendronate Sodium] Nausea And Vomiting   Simvastatin    "legs weak"      Medication List    STOP taking these medications   doxycycline 20 MG tablet Commonly known as: PERIOSTAT   naproxen 500 MG tablet Commonly known as: NAPROSYN     TAKE these medications   amLODipine 10 MG tablet Commonly known as: NORVASC Take 10 mg by mouth daily.   aspirin EC 81 MG tablet Take 81 mg by mouth daily.   calcium carbonate 500 MG chewable tablet Commonly known as: TUMS - dosed in mg elemental calcium Chew 2 tablets by mouth 2 (two) times daily as needed for indigestion or heartburn.   CALCIUM CITRATE+D3 PETITES PO Take 1 tablet by mouth in the morning and at bedtime.   cholecalciferol 1000 units tablet Commonly known as: VITAMIN D  Take 1,000 Units by mouth daily.   docusate sodium 100 MG capsule Commonly known as: COLACE Take 200 mg by mouth at bedtime.   enalapril 20 MG tablet Commonly known as: VASOTEC Take 20 mg by mouth daily.   HYDROcodone-acetaminophen 5-325 MG tablet Commonly known as: NORCO/VICODIN Take 1 tablet by mouth 2 (two) times daily.   ketoconazole 2 % shampoo Commonly known as: NIZORAL Apply 1 application topically 3 (three) times a  week.   loratadine 10 MG tablet Commonly known as: CLARITIN Take 10 mg by mouth daily. Only takes in Summer   metoprolol succinate 25 MG 24 hr tablet Commonly known as: TOPROL-XL Take 25 mg by mouth daily.   montelukast 10 MG tablet Commonly known as: SINGULAIR Take 10 mg by mouth at bedtime.   multivitamin with minerals tablet Take 1 tablet by mouth daily.   nitroGLYCERIN 0.4 MG SL tablet Commonly known as: NITROSTAT DISSOLVE ONE TABLET UNDER TONGUE EVERY 5 MINUTES AS NEEDED UP TO 3 DOSES. IF NORELIEF CALL 911. What changed: See the new instructions.   Omega-3 Krill Oil 500 MG Caps Take 500 mg by mouth daily.   rosuvastatin 20 MG tablet Commonly known as: CRESTOR Take 20 mg by mouth See admin instructions. Three times a week on Tuesday, Wednesday and Saturday   vitamin C with rose hips 500 MG tablet Take 500 mg by mouth daily.   Zinc 50 MG Tabs Take 50 mg by mouth daily.      Allergies  Allergen Reactions  . Codeine Nausea And Vomiting  . Fosamax [Alendronate Sodium] Nausea And Vomiting  . Simvastatin     "legs weak"   Follow-up Information    Manfred Shirts, PA Follow up in 2 week(s).   Specialty: General Practice Contact information: [place] [address] [phone]            The results of significant diagnostics from this hospitalization (including imaging, microbiology, ancillary and laboratory) are listed below for reference.    Significant Diagnostic Studies: CT ABDOMEN PELVIS W CONTRAST  Result Date: 06/04/2019 CLINICAL DATA:  79 year old female with history of abdominal distension. EXAM: CT ABDOMEN AND PELVIS WITH CONTRAST TECHNIQUE: Multidetector CT imaging of the abdomen and pelvis was performed using the standard protocol following bolus administration of intravenous contrast. CONTRAST:  165mL OMNIPAQUE IOHEXOL 300 MG/ML  SOLN COMPARISON:  No priors. FINDINGS: Lower chest: Aortic atherosclerosis. Atherosclerotic calcifications  in the left anterior descending, left circumflex and right coronary arteries. Calcifications of the aortic valve. Small hiatal hernia. Hepatobiliary: No suspicious cystic or solid hepatic lesions. No intra or extrahepatic biliary ductal dilatation. Several noncalcified gallstones are noted within the gallbladder measuring up to 2.7 cm. No findings to suggest an acute cholecystitis at this time. Pancreas: No pancreatic mass. No pancreatic ductal dilatation. No pancreatic or peripancreatic fluid collections or inflammatory changes. Spleen: Unremarkable. Adrenals/Urinary Tract: Subcentimeter low-attenuation lesions in both kidneys, too small to characterize, but statistically likely to represent tiny cysts. No hydroureteronephrosis. Urinary bladder is normal in appearance. Bilateral adrenal glands are normal in appearance. Stomach/Bowel: Normal appearance of the stomach. No pathologic dilatation of small bowel or colon. A few scattered colonic diverticulae are noted, without surrounding inflammatory changes to suggest an acute diverticulitis at this time. Normal appendix. Vascular/Lymphatic: Aortic atherosclerosis, without evidence of aneurysm or dissection in the abdominal or pelvic vasculature. No lymphadenopathy noted in the abdomen or pelvis. Reproductive: Status post hysterectomy. Ovaries are not confidently identified may be surgically absent or  atrophic. Other: No significant volume of ascites.  No pneumoperitoneum. Musculoskeletal: There are no aggressive appearing lytic or blastic lesions noted in the visualized portions of the skeleton. IMPRESSION: 1. No acute findings are noted in the abdomen or pelvis to account for the patient's symptoms. 2. Cholelithiasis without evidence of acute cholecystitis at this time. 3. Colonic diverticulosis without evidence of acute diverticulitis at this time. 4. Aortic atherosclerosis, in addition to least 3 vessel coronary artery disease. Assessment for potential risk factor  modification, dietary therapy or pharmacologic therapy may be warranted, if clinically indicated. 5. There are calcifications of the aortic valve. Echocardiographic correlation for evaluation of potential valvular dysfunction may be warranted if clinically indicated. 6. Small hiatal hernia. 7. Additional incidental findings, as above. Electronically Signed   By: Vinnie Langton M.D.   On: 06/04/2019 18:03    Microbiology: Recent Results (from the past 240 hour(s))  SARS CORONAVIRUS 2 (TAT 6-24 HRS) Nasopharyngeal Urine, Clean Catch     Status: None   Collection Time: 06/04/19  4:10 PM   Specimen: Urine, Clean Catch; Nasopharyngeal  Result Value Ref Range Status   SARS Coronavirus 2 NEGATIVE NEGATIVE Final    Comment: (NOTE) SARS-CoV-2 target nucleic acids are NOT DETECTED. The SARS-CoV-2 RNA is generally detectable in upper and lower respiratory specimens during the acute phase of infection. Negative results do not preclude SARS-CoV-2 infection, do not rule out co-infections with other pathogens, and should not be used as the sole basis for treatment or other patient management decisions. Negative results must be combined with clinical observations, patient history, and epidemiological information. The expected result is Negative. Fact Sheet for Patients: SugarRoll.be Fact Sheet for Healthcare Providers: https://www.woods-mathews.com/ This test is not yet approved or cleared by the [place] FDA and  has been authorized for detection and/or diagnosis of SARS-CoV-2 by FDA under an Emergency Use Authorization (EUA). This EUA will remain  in effect (meaning this test can be used) for the duration of the COVID-19 declaration under Section 56 4(b)(1) of the Act, 21 U.S.C. section 360bbb-3(b)(1), unless the authorization is terminated or revoked sooner. Performed at [facility], [place] [address] [place], [place] [address]   Urine culture      Status: Abnormal   Collection Time: 06/04/19  4:12 PM   Specimen: Urine, Random  Result Value Ref Range Status   Specimen Description   Final    URINE, RANDOM Performed at [facility], [address] [place], [place] [address]    Special Requests   Final    NONE Performed at [facility], [address] [place], [place] [address]    Culture MULTIPLE SPECIES PRESENT, SUGGEST RECOLLECTION (A)  Final   Report Status 06/05/2019 FINAL  Final  MRSA PCR Screening     Status: None   Collection Time: 06/04/19  9:34 PM   Specimen: Nasopharyngeal  Result Value Ref Range Status   MRSA by PCR NEGATIVE NEGATIVE Final    Comment:        The GeneXpert MRSA Assay (FDA approved for NASAL specimens only), is one component of a comprehensive MRSA colonization surveillance program. It is not intended to diagnose MRSA infection nor to guide or monitor treatment for MRSA infections. Performed at [facility], [place] [address] [place], [place] [address]      Labs: Basic Metabolic Panel: Recent Labs  Lab 06/04/19 1606 06/05/19 0203 06/06/19 0234  NA 133* 140 139  K 3.5  3.8 3.4*  CL 99 107 111  CO2 24 24 20*  GLUCOSE 122* 112* 101*  BUN 18 15 8   CREATININE 0.83 0.74 0.50  CALCIUM 10.4* 9.1 8.6*   Liver Function Tests: Recent Labs  Lab 06/04/19 1606  AST 24  ALT 17  ALKPHOS 94  BILITOT 0.5  PROT 7.6  ALBUMIN 4.5   Recent Labs  Lab 06/04/19 1606  LIPASE 20   No results for input(s): AMMONIA in the last 168 hours. CBC: Recent Labs  Lab 06/04/19 1606 06/04/19 2033 06/04/19 2303 06/05/19 0203 06/05/19 0634 06/05/19 1002 06/06/19 0234  WBC 12.1*   < > 9.3 8.0 6.9 7.0 6.9  NEUTROABS 9.4*  --   --   --   --   --   --   HGB 15.2*   < > 13.1 11.6* 11.3* 11.1* 10.8*  HCT 47.1*   < > 41.4 36.6 35.7* 34.7* 34.1*  MCV 90.9   < > 91.4 92.9 92.5 92.3 92.4  PLT 242   < > 224 206 189 179 175   < > = values in this interval not  displayed.       Signed:  Oswald Hillock MD.  [facility] 06/06/2019, 1:38 PM

## 2019-06-06 NOTE — Progress Notes (Addendum)
[facility]  PROGRESS NOTE  Theresa Prince [id number] DOB: 1940/10/24 DOA: 06/04/2019 PCP: Manfred Shirts, PA   Brief HPI:   79 year old female with a history of CAD s/p stenting in February 2019, history of stroke with PFO on aspirin came to ED with multiple episodes of rectal bleeding since yesterday afternoon.  Denied abdominal pain, nausea, vomiting, fever or chills. CT abdomen pelvis showed colonic diverticulosis without acute abnormality. Her hemoglobin has remained stable Gastroenterology was consulted.    Subjective   Patient seen and examined, blood pressure was elevated last night.  Patient felt dizzy after she received IV hydralazine.   Assessment/Plan:     1. Acute lower GI bleed-secondary to diverticulosis.  Aspirin is currently on hold.  Gastroenterology has been consulted.  Hemoglobin has remained stable.  Today hemoglobin is 10.8.  Hemodynamically patient is stable.  Plan for colonoscopy today.  Patient is currently n.p.o. 2. Anemia-limited to acute blood loss due to above.  IMA globin has remained stable at 10.8.  Patient is hemodynamically stable.  Transfuse for hemoglobin less than 7. 3. Hypokalemia-potassium is 3.4, replace potassium today.  Follow BMP in a.m. 4. Hypertension-blood pressure has been elevated.  Patient is on Toprol-XL 25 mg daily.  Will restart her home medications including enalapril 20 mg daily, amlodipine 10 mg p.o. daily. 5. CAD s/p stenting-patient had coronary stenting in February 2019.  Denies any chest pain.  Aspirin is currently on hold. 6. History of stroke/PFO-stable, aspirin on hold.    SpO2: 97 %     Lab Results  Component Value Date   SARSCOV2NAA NEGATIVE 06/04/2019     CBG: No results for input(s): GLUCAP in the last 168 hours.  CBC: Recent Labs  Lab 06/04/19 1606 06/04/19 2033 06/04/19 2303 06/05/19 0203 06/05/19 0634 06/05/19 1002 06/06/19 0234  WBC 12.1*   < > 9.3 8.0 6.9 7.0 6.9  NEUTROABS 9.4*  --    --   --   --   --   --   HGB 15.2*   < > 13.1 11.6* 11.3* 11.1* 10.8*  HCT 47.1*   < > 41.4 36.6 35.7* 34.7* 34.1*  MCV 90.9   < > 91.4 92.9 92.5 92.3 92.4  PLT 242   < > 224 206 189 179 175   < > = values in this interval not displayed.    Basic Metabolic Panel: Recent Labs  Lab 06/04/19 1606 06/05/19 0203 06/06/19 0234  NA 133* 140 139  K 3.5 3.8 3.4*  CL 99 107 111  CO2 24 24 20*  GLUCOSE 122* 112* 101*  BUN 18 15 8   CREATININE 0.83 0.74 0.50  CALCIUM 10.4* 9.1 8.6*     Liver Function Tests: Recent Labs  Lab 06/04/19 1606  AST 24  ALT 17  ALKPHOS 94  BILITOT 0.5  PROT 7.6  ALBUMIN 4.5        DVT prophylaxis: SCDs  Code Status: Full code  Family Communication: No family at bedside  Disposition Plan: Patient admitted with rectal bleeding likely from diverticulosis.  Gastroenterology has been consulted, likely home when rectal bleeding stops and hemoglobin remained stable.  Plan for colonoscopy today.  We will follow colonoscopy results.         Scheduled medications:  . [MAR Hold] amLODipine  10 mg Oral Daily  . [MAR Hold] Chlorhexidine Gluconate Cloth  6 each Topical Daily  . [MAR Hold] enalapril  20 mg Oral Daily  . [MAR Hold] HYDROcodone-acetaminophen  1 tablet  Oral BID  . [MAR Hold] metoprolol succinate  25 mg Oral Daily    Consultants:  Gastroenterology  Procedures:  None  Antibiotics:   Anti-infectives (From admission, onward)   None       Objective   Vitals:   06/06/19 0800 06/06/19 0855 06/06/19 0900 06/06/19 1010  BP: (!) 182/61  (!) 172/73 (!) 162/56  Pulse: 84 88 87 83  Resp: (!) 21 16 19  (!) 22  Temp: 99.1 F (37.3 C)   98.2 F (36.8 C)  TempSrc: Oral   Oral  SpO2: 97% 98% 97% 97%  Weight:    81.6 kg  Height:    5\' 3"  (1.6 m)    Intake/Output Summary (Last 24 hours) at 06/06/2019 1113 Last data filed at 06/05/2019 1600 Gross per 24 hour  Intake 3270.66 ml  Output --  Net 3270.66 ml    02/21 1901 - 02/23  0700 In: 4126.5 [P.O.:2300; I.V.:1826.5] Out: -   Filed Weights   06/04/19 1514 06/06/19 1010  Weight: 81.6 kg 81.6 kg    Physical Examination:   General-appears in no acute distress  Heart-S1-S2, regular, no murmur auscultated  Lungs-clear to auscultation bilaterally, no wheezing or crackles auscultated  Abdomen-soft, nontender, no organomegaly  Extremities-no edema in the lower extremities  Neuro-alert, oriented x3, no focal deficit noted    Data Reviewed:   Recent Results (from the past 240 hour(s))  SARS CORONAVIRUS 2 (TAT 6-24 HRS) Nasopharyngeal Urine, Clean Catch     Status: None   Collection Time: 06/04/19  4:10 PM   Specimen: Urine, Clean Catch; Nasopharyngeal  Result Value Ref Range Status   SARS Coronavirus 2 NEGATIVE NEGATIVE Final    Comment: (NOTE) SARS-CoV-2 target nucleic acids are NOT DETECTED. The SARS-CoV-2 RNA is generally detectable in upper and lower respiratory specimens during the acute phase of infection. Negative results do not preclude SARS-CoV-2 infection, do not rule out co-infections with other pathogens, and should not be used as the sole basis for treatment or other patient management decisions. Negative results must be combined with clinical observations, patient history, and epidemiological information. The expected result is Negative. Fact Sheet for Patients: SugarRoll.be Fact Sheet for Healthcare Providers: https://www.woods-mathews.com/ This test is not yet approved or cleared by the [place] FDA and  has been authorized for detection and/or diagnosis of SARS-CoV-2 by FDA under an Emergency Use Authorization (EUA). This EUA will remain  in effect (meaning this test can be used) for the duration of the COVID-19 declaration under Section 56 4(b)(1) of the Act, 21 U.S.C. section 360bbb-3(b)(1), unless the authorization is terminated or revoked sooner. Performed at [facility], [place] [address], [place], [place] [address]   Urine culture     Status: Abnormal   Collection Time: 06/04/19  4:12 PM   Specimen: Urine, Random  Result Value Ref Range Status   Specimen Description   Final    URINE, RANDOM Performed at [facility], [address] [place], [place] [address]    Special Requests   Final    NONE Performed at [facility], [address] [place], [place] [address]    Culture MULTIPLE SPECIES PRESENT, SUGGEST RECOLLECTION (A)  Final   Report Status 06/05/2019 FINAL  Final  MRSA PCR Screening     Status: None   Collection Time: 06/04/19  9:34 PM   Specimen: Nasopharyngeal  Result Value Ref Range Status   MRSA by PCR NEGATIVE NEGATIVE Final  Comment:        The GeneXpert MRSA Assay (FDA approved for NASAL specimens only), is one component of a comprehensive MRSA colonization surveillance program. It is not intended to diagnose MRSA infection nor to guide or monitor treatment for MRSA infections. Performed at [facility], [address] [place], [place] [address]     Recent Labs  Lab 06/04/19 1606  LIPASE 20   No results for input(s): AMMONIA in the last 168 hours.  Cardiac Enzymes: No results for input(s): CKTOTAL, CKMB, CKMBINDEX, TROPONINI in the last 168 hours. BNP (last 3 results) No results for input(s): BNP in the last 8760 hours.  ProBNP (last 3 results) No results for input(s): PROBNP in the last 8760 hours.  Studies:  CT ABDOMEN PELVIS W CONTRAST  Result Date: 06/04/2019 CLINICAL DATA:  79 year old female with history of abdominal distension. EXAM: CT ABDOMEN AND PELVIS WITH CONTRAST TECHNIQUE: Multidetector CT imaging of the abdomen and pelvis was performed using the standard protocol following bolus administration of intravenous contrast. CONTRAST:  169mL OMNIPAQUE IOHEXOL 300 MG/ML  SOLN COMPARISON:  No priors. FINDINGS: Lower chest: Aortic atherosclerosis. Atherosclerotic  calcifications in the left anterior descending, left circumflex and right coronary arteries. Calcifications of the aortic valve. Small hiatal hernia. Hepatobiliary: No suspicious cystic or solid hepatic lesions. No intra or extrahepatic biliary ductal dilatation. Several noncalcified gallstones are noted within the gallbladder measuring up to 2.7 cm. No findings to suggest an acute cholecystitis at this time. Pancreas: No pancreatic mass. No pancreatic ductal dilatation. No pancreatic or peripancreatic fluid collections or inflammatory changes. Spleen: Unremarkable. Adrenals/Urinary Tract: Subcentimeter low-attenuation lesions in both kidneys, too small to characterize, but statistically likely to represent tiny cysts. No hydroureteronephrosis. Urinary bladder is normal in appearance. Bilateral adrenal glands are normal in appearance. Stomach/Bowel: Normal appearance of the stomach. No pathologic dilatation of small bowel or colon. A few scattered colonic diverticulae are noted, without surrounding inflammatory changes to suggest an acute diverticulitis at this time. Normal appendix. Vascular/Lymphatic: Aortic atherosclerosis, without evidence of aneurysm or dissection in the abdominal or pelvic vasculature. No lymphadenopathy noted in the abdomen or pelvis. Reproductive: Status post hysterectomy. Ovaries are not confidently identified may be surgically absent or atrophic. Other: No significant volume of ascites.  No pneumoperitoneum. Musculoskeletal: There are no aggressive appearing lytic or blastic lesions noted in the visualized portions of the skeleton. IMPRESSION: 1. No acute findings are noted in the abdomen or pelvis to account for the patient's symptoms. 2. Cholelithiasis without evidence of acute cholecystitis at this time. 3. Colonic diverticulosis without evidence of acute diverticulitis at this time. 4. Aortic atherosclerosis, in addition to least 3 vessel coronary artery disease. Assessment for  potential risk factor modification, dietary therapy or pharmacologic therapy may be warranted, if clinically indicated. 5. There are calcifications of the aortic valve. Echocardiographic correlation for evaluation of potential valvular dysfunction may be warranted if clinically indicated. 6. Small hiatal hernia. 7. Additional incidental findings, as above. Electronically Signed   By: Vinnie Langton M.D.   On: 06/04/2019 18:03       [place]   [facility] If 7PM-7AM, please contact night-coverage at [email or web address], Office  [phone]   06/06/2019, 11:13 AM  LOS: 0 days

## 2019-06-06 NOTE — Progress Notes (Signed)
[facility] Progress Note  Theresa Prince 79 y.o. [id number]  CC: Rectal bleeding   Subjective: Patient seen and examined at bedside.  Denies any further bleeding episodes.  Denies abdominal pain.  Had some nausea while drinking prep  ROS : Anxious about her blood pressure.  Denies acute chest pain.   Objective: Vital signs in last 24 hours: Vitals:   06/06/19 0800 06/06/19 0900  BP: (!) 182/61 (!) 172/73  Pulse: 84 87  Resp: (!) 21 19  Temp: 99.1 F (37.3 C)   SpO2: 97% 97%    Physical Exam:  General.  Well developed.  Not in acute distress Abdomen.  Soft, nontender, nondistended, bowel sounds present Neuro.  Alert/oriented x3 Psych-mood and affect normal some anxiety noted  Lab Results: Recent Labs    06/05/19 0203 06/06/19 0234  NA 140 139  K 3.8 3.4*  CL 107 111  CO2 24 20*  GLUCOSE 112* 101*  BUN 15 8  CREATININE 0.74 0.50  CALCIUM 9.1 8.6*   Recent Labs    06/04/19 1606  AST 24  ALT 17  ALKPHOS 94  BILITOT 0.5  PROT 7.6  ALBUMIN 4.5   Recent Labs    06/04/19 1606 06/04/19 2033 06/05/19 1002 06/06/19 0234  WBC 12.1*   < > 7.0 6.9  NEUTROABS 9.4*  --   --   --   HGB 15.2*   < > 11.1* 10.8*  HCT 47.1*   < > 34.7* 34.1*  MCV 90.9   < > 92.3 92.4  PLT 242   < > 179 175   < > = values in this interval not displayed.   No results for input(s): LABPROT, INR in the last 72 hours.    Assessment/Plan: -Painless hematochezia.  Most likely diverticular bleeding.  CT abdomen pelvis with contrast showed diverticulosis without any acute GI changes -Acute blood loss anemia.  Hemoglobin down to 10.8 -History of coronary artery disease.  On aspirin  Recommendations -------------------------- -Proceed with colonoscopy today  Risks (bleeding, infection, bowel perforation that could require surgery, sedation-related changes in cardiopulmonary systems), benefits (identification and possible treatment of source of symptoms, exclusion of  certain causes of symptoms), and alternatives (watchful waiting, radiographic imaging studies, empiric medical treatment)  were explained to patient  in detail and patient wishes to proceed.   Otis Brace MD, [place] 06/06/2019, 9:47 AM  Contact #  [phone]

## 2019-06-06 NOTE — Anesthesia Preprocedure Evaluation (Addendum)
Anesthesia Evaluation  Patient identified by MRN, date of birth, ID band Patient awake    Reviewed: Allergy & Precautions, NPO status , Patient's Chart, lab work & pertinent test results  Airway Mallampati: II  TM Distance: >3 FB Neck ROM: Full    Dental  (+) Poor Dentition, Dental Advisory Given   Pulmonary neg pulmonary ROS,    Pulmonary exam normal breath sounds clear to auscultation       Cardiovascular hypertension, + CAD, + Past MI and + Peripheral Vascular Disease  Normal cardiovascular exam+ Valvular Problems/Murmurs MR  Rhythm:Regular Rate:Normal  Echo 05/14/2017: LVEF 65-70%, mild LVH, normal wall motion, grade 1 DD,  indeterminate LV filling pressure, aortic valve sclerosis, mildMR, mild LAE, small PFO by color doppler, mild TR, RVSP 39 mmHg,normal IVC.   Cardiac cath 05/14/17 showed multivessel disease with 25% Prox-distal RCA, 75% rPDA, 95% OM2 (treated with PTCA), 90% prox-mid Cx (treated with DES), 25% prox LAD< 80% mLAD (treated with DES), 50% mid-distal LAD, 80% distal LAD, LVEDP normal, LVEF 55-65%.  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, left middle cerebral artery (HCC) CVA, Residual Symptoms    GI/Hepatic Neg liver ROS, GI bleed   Endo/Other  negative endocrine ROS  Renal/GU negative Renal ROS  negative genitourinary   Musculoskeletal  (+) Arthritis , Osteoarthritis,  Chronic LBP   Abdominal   Peds  Hematology negative hematology ROS (+) hct 34.1   Anesthesia Other Findings   Reproductive/Obstetrics                            Anesthesia Physical Anesthesia Plan  ASA: III  Anesthesia Plan: MAC   Post-op Pain Management:    Induction:   PONV Risk Score and Plan: Propofol infusion and TIVA  Airway Management Planned: Natural Airway  Additional Equipment: None  Intra-op Plan:    Post-operative Plan:   Informed Consent: I have reviewed the patients History and Physical, chart, labs and discussed the procedure including the risks, benefits and alternatives for the proposed anesthesia with the patient or authorized representative who has indicated his/her understanding and acceptance.       Plan Discussed with: CRNA  Anesthesia Plan Comments:         Anesthesia Quick Evaluation

## 2019-06-06 NOTE — Progress Notes (Addendum)
BP elevated RN gave PRN apresoline. Will cont to monitor.

## 2019-06-06 NOTE — Progress Notes (Signed)
Secure message sent to on call NP regarding fluctuation of Pts BP elevation. Will cont to monitor.

## 2019-06-06 NOTE — Brief Op Note (Signed)
06/04/2019 - 06/06/2019  11:22 AM  PATIENT:  Theresa Prince  79 y.o. female  PRE-OPERATIVE DIAGNOSIS:  GI bleed  POST-OPERATIVE DIAGNOSIS:  cecal polyp biopsy, ileocecal polypectomy, rectal polypectomy  PROCEDURE:  Procedure(s): COLONOSCOPY WITH PROPOFOL (N/A) BIOPSY POLYPECTOMY  SURGEON:  Surgeon(s) and Role:    * Chastity Noland, MD - Primary  Findings ---------- -Colonoscopy negative for active bleeding. -Few polyps removed with biopsy and cold snare. -Pancolonic diverticulosis and hemorrhoids noted.  Recommendations ------------------------- -Advance diet -Follow path -No repeat colonoscopy because of age -Okay to discharge from GI standpoint.  GI will sign off.  Call us back if needed  Otis Brace MD, [place] 06/06/2019, 11:23 AM  Contact #  [phone]

## 2019-06-07 ENCOUNTER — Telehealth: Payer: Self-pay | Admitting: Emergency Medicine

## 2019-06-07 LAB — SURGICAL PATHOLOGY

## 2019-06-08 ENCOUNTER — Encounter: Payer: Self-pay | Admitting: *Deleted

## 2019-06-19 DIAGNOSIS — R7303 Prediabetes: Secondary | ICD-10-CM | POA: Diagnosis not present

## 2019-06-19 DIAGNOSIS — M1712 Unilateral primary osteoarthritis, left knee: Secondary | ICD-10-CM | POA: Diagnosis not present

## 2019-06-19 DIAGNOSIS — M85862 Other specified disorders of bone density and structure, left lower leg: Secondary | ICD-10-CM | POA: Diagnosis not present

## 2019-06-19 DIAGNOSIS — M170 Bilateral primary osteoarthritis of knee: Secondary | ICD-10-CM | POA: Diagnosis not present

## 2019-06-19 DIAGNOSIS — M85861 Other specified disorders of bone density and structure, right lower leg: Secondary | ICD-10-CM | POA: Diagnosis not present

## 2019-06-19 DIAGNOSIS — Z6832 Body mass index (BMI) 32.0-32.9, adult: Secondary | ICD-10-CM | POA: Diagnosis not present

## 2019-06-20 DIAGNOSIS — M7050 Other bursitis of knee, unspecified knee: Secondary | ICD-10-CM

## 2019-06-20 HISTORY — DX: Other bursitis of knee, unspecified knee: M70.50

## 2019-06-22 DIAGNOSIS — Z86010 Personal history of colonic polyps: Secondary | ICD-10-CM | POA: Diagnosis not present

## 2019-06-22 DIAGNOSIS — Z8719 Personal history of other diseases of the digestive system: Secondary | ICD-10-CM | POA: Diagnosis not present

## 2019-07-18 DIAGNOSIS — H2513 Age-related nuclear cataract, bilateral: Secondary | ICD-10-CM | POA: Diagnosis not present

## 2019-07-18 DIAGNOSIS — H25013 Cortical age-related cataract, bilateral: Secondary | ICD-10-CM | POA: Diagnosis not present

## 2019-07-18 DIAGNOSIS — H35371 Puckering of macula, right eye: Secondary | ICD-10-CM | POA: Diagnosis not present

## 2019-07-18 DIAGNOSIS — H353131 Nonexudative age-related macular degeneration, bilateral, early dry stage: Secondary | ICD-10-CM | POA: Diagnosis not present

## 2019-07-18 DIAGNOSIS — H3554 Dystrophies primarily involving the retinal pigment epithelium: Secondary | ICD-10-CM | POA: Diagnosis not present

## 2019-07-25 DIAGNOSIS — E559 Vitamin D deficiency, unspecified: Secondary | ICD-10-CM | POA: Diagnosis not present

## 2019-07-25 DIAGNOSIS — R7301 Impaired fasting glucose: Secondary | ICD-10-CM | POA: Diagnosis not present

## 2019-07-25 DIAGNOSIS — I10 Essential (primary) hypertension: Secondary | ICD-10-CM | POA: Diagnosis not present

## 2019-07-25 DIAGNOSIS — D500 Iron deficiency anemia secondary to blood loss (chronic): Secondary | ICD-10-CM | POA: Diagnosis not present

## 2019-07-25 DIAGNOSIS — E782 Mixed hyperlipidemia: Secondary | ICD-10-CM | POA: Diagnosis not present

## 2019-07-25 DIAGNOSIS — K5730 Diverticulosis of large intestine without perforation or abscess without bleeding: Secondary | ICD-10-CM | POA: Diagnosis not present

## 2019-11-13 DIAGNOSIS — E782 Mixed hyperlipidemia: Secondary | ICD-10-CM | POA: Diagnosis not present

## 2019-11-13 DIAGNOSIS — M159 Polyosteoarthritis, unspecified: Secondary | ICD-10-CM | POA: Diagnosis not present

## 2019-11-13 DIAGNOSIS — N3281 Overactive bladder: Secondary | ICD-10-CM | POA: Diagnosis not present

## 2019-11-13 DIAGNOSIS — R7301 Impaired fasting glucose: Secondary | ICD-10-CM | POA: Diagnosis not present

## 2019-11-13 DIAGNOSIS — D500 Iron deficiency anemia secondary to blood loss (chronic): Secondary | ICD-10-CM | POA: Diagnosis not present

## 2019-11-13 DIAGNOSIS — I10 Essential (primary) hypertension: Secondary | ICD-10-CM | POA: Diagnosis not present

## 2020-01-30 DIAGNOSIS — H25013 Cortical age-related cataract, bilateral: Secondary | ICD-10-CM | POA: Diagnosis not present

## 2020-01-30 DIAGNOSIS — H2513 Age-related nuclear cataract, bilateral: Secondary | ICD-10-CM | POA: Diagnosis not present

## 2020-01-30 DIAGNOSIS — H524 Presbyopia: Secondary | ICD-10-CM | POA: Diagnosis not present

## 2020-01-30 DIAGNOSIS — H52203 Unspecified astigmatism, bilateral: Secondary | ICD-10-CM | POA: Diagnosis not present

## 2020-01-30 DIAGNOSIS — H5203 Hypermetropia, bilateral: Secondary | ICD-10-CM | POA: Diagnosis not present

## 2020-01-30 DIAGNOSIS — H35371 Puckering of macula, right eye: Secondary | ICD-10-CM | POA: Diagnosis not present

## 2020-01-30 DIAGNOSIS — H353131 Nonexudative age-related macular degeneration, bilateral, early dry stage: Secondary | ICD-10-CM | POA: Diagnosis not present

## 2020-01-30 DIAGNOSIS — H3554 Dystrophies primarily involving the retinal pigment epithelium: Secondary | ICD-10-CM | POA: Diagnosis not present

## 2020-01-31 ENCOUNTER — Other Ambulatory Visit: Payer: Self-pay | Admitting: Cardiology

## 2020-02-20 DIAGNOSIS — Z8673 Personal history of transient ischemic attack (TIA), and cerebral infarction without residual deficits: Secondary | ICD-10-CM | POA: Diagnosis not present

## 2020-02-20 DIAGNOSIS — Z7982 Long term (current) use of aspirin: Secondary | ICD-10-CM | POA: Diagnosis not present

## 2020-02-20 DIAGNOSIS — Z79899 Other long term (current) drug therapy: Secondary | ICD-10-CM | POA: Diagnosis not present

## 2020-02-20 DIAGNOSIS — I10 Essential (primary) hypertension: Secondary | ICD-10-CM | POA: Diagnosis not present

## 2020-02-20 DIAGNOSIS — M159 Polyosteoarthritis, unspecified: Secondary | ICD-10-CM | POA: Diagnosis not present

## 2020-02-20 DIAGNOSIS — R7301 Impaired fasting glucose: Secondary | ICD-10-CM | POA: Diagnosis not present

## 2020-02-20 DIAGNOSIS — E782 Mixed hyperlipidemia: Secondary | ICD-10-CM | POA: Diagnosis not present

## 2020-02-28 DIAGNOSIS — Z90710 Acquired absence of both cervix and uterus: Secondary | ICD-10-CM | POA: Diagnosis not present

## 2020-02-28 DIAGNOSIS — L900 Lichen sclerosus et atrophicus: Secondary | ICD-10-CM | POA: Diagnosis not present

## 2020-02-28 DIAGNOSIS — M8580 Other specified disorders of bone density and structure, unspecified site: Secondary | ICD-10-CM | POA: Diagnosis not present

## 2020-02-28 DIAGNOSIS — Z01419 Encounter for gynecological examination (general) (routine) without abnormal findings: Secondary | ICD-10-CM | POA: Diagnosis not present

## 2020-03-26 DIAGNOSIS — J0100 Acute maxillary sinusitis, unspecified: Secondary | ICD-10-CM | POA: Diagnosis not present

## 2020-04-16 DIAGNOSIS — L900 Lichen sclerosus et atrophicus: Secondary | ICD-10-CM | POA: Diagnosis not present

## 2020-07-08 DIAGNOSIS — Z8673 Personal history of transient ischemic attack (TIA), and cerebral infarction without residual deficits: Secondary | ICD-10-CM | POA: Diagnosis not present

## 2020-07-08 DIAGNOSIS — M159 Polyosteoarthritis, unspecified: Secondary | ICD-10-CM | POA: Diagnosis not present

## 2020-07-08 DIAGNOSIS — E559 Vitamin D deficiency, unspecified: Secondary | ICD-10-CM | POA: Diagnosis not present

## 2020-07-08 DIAGNOSIS — E782 Mixed hyperlipidemia: Secondary | ICD-10-CM | POA: Diagnosis not present

## 2020-07-08 DIAGNOSIS — R7301 Impaired fasting glucose: Secondary | ICD-10-CM | POA: Diagnosis not present

## 2020-07-08 DIAGNOSIS — I10 Essential (primary) hypertension: Secondary | ICD-10-CM | POA: Diagnosis not present

## 2020-07-08 DIAGNOSIS — D500 Iron deficiency anemia secondary to blood loss (chronic): Secondary | ICD-10-CM | POA: Diagnosis not present

## 2020-07-08 DIAGNOSIS — R5383 Other fatigue: Secondary | ICD-10-CM | POA: Diagnosis not present

## 2020-08-08 DIAGNOSIS — K644 Residual hemorrhoidal skin tags: Secondary | ICD-10-CM | POA: Diagnosis not present

## 2020-08-08 DIAGNOSIS — I10 Essential (primary) hypertension: Secondary | ICD-10-CM | POA: Diagnosis not present

## 2020-08-14 DIAGNOSIS — H2513 Age-related nuclear cataract, bilateral: Secondary | ICD-10-CM | POA: Diagnosis not present

## 2020-08-14 DIAGNOSIS — H353131 Nonexudative age-related macular degeneration, bilateral, early dry stage: Secondary | ICD-10-CM | POA: Diagnosis not present

## 2020-08-14 DIAGNOSIS — H25013 Cortical age-related cataract, bilateral: Secondary | ICD-10-CM | POA: Diagnosis not present

## 2020-08-14 DIAGNOSIS — H35371 Puckering of macula, right eye: Secondary | ICD-10-CM | POA: Diagnosis not present

## 2020-08-22 DIAGNOSIS — L03116 Cellulitis of left lower limb: Secondary | ICD-10-CM | POA: Diagnosis not present

## 2020-08-22 DIAGNOSIS — I83813 Varicose veins of bilateral lower extremities with pain: Secondary | ICD-10-CM | POA: Diagnosis not present

## 2020-08-22 DIAGNOSIS — Z7982 Long term (current) use of aspirin: Secondary | ICD-10-CM | POA: Diagnosis not present

## 2020-08-22 DIAGNOSIS — R238 Other skin changes: Secondary | ICD-10-CM | POA: Diagnosis not present

## 2020-08-22 DIAGNOSIS — M7989 Other specified soft tissue disorders: Secondary | ICD-10-CM | POA: Diagnosis not present

## 2020-08-22 DIAGNOSIS — Z79899 Other long term (current) drug therapy: Secondary | ICD-10-CM | POA: Diagnosis not present

## 2020-08-22 DIAGNOSIS — Z7951 Long term (current) use of inhaled steroids: Secondary | ICD-10-CM | POA: Diagnosis not present

## 2020-08-22 DIAGNOSIS — M79662 Pain in left lower leg: Secondary | ICD-10-CM | POA: Diagnosis not present

## 2020-08-23 DIAGNOSIS — I8002 Phlebitis and thrombophlebitis of superficial vessels of left lower extremity: Secondary | ICD-10-CM | POA: Diagnosis not present

## 2020-08-23 DIAGNOSIS — R600 Localized edema: Secondary | ICD-10-CM | POA: Diagnosis not present

## 2020-09-02 DIAGNOSIS — H6123 Impacted cerumen, bilateral: Secondary | ICD-10-CM | POA: Diagnosis not present

## 2020-09-02 DIAGNOSIS — H93293 Other abnormal auditory perceptions, bilateral: Secondary | ICD-10-CM | POA: Diagnosis not present

## 2020-09-05 DIAGNOSIS — Z8249 Family history of ischemic heart disease and other diseases of the circulatory system: Secondary | ICD-10-CM | POA: Diagnosis not present

## 2020-09-05 DIAGNOSIS — I8002 Phlebitis and thrombophlebitis of superficial vessels of left lower extremity: Secondary | ICD-10-CM | POA: Insufficient documentation

## 2020-09-05 DIAGNOSIS — I83813 Varicose veins of bilateral lower extremities with pain: Secondary | ICD-10-CM | POA: Diagnosis not present

## 2020-10-09 DIAGNOSIS — I8002 Phlebitis and thrombophlebitis of superficial vessels of left lower extremity: Secondary | ICD-10-CM | POA: Diagnosis not present

## 2020-10-09 DIAGNOSIS — I10 Essential (primary) hypertension: Secondary | ICD-10-CM | POA: Diagnosis not present

## 2020-10-09 DIAGNOSIS — I739 Peripheral vascular disease, unspecified: Secondary | ICD-10-CM | POA: Diagnosis not present

## 2020-10-09 DIAGNOSIS — I83813 Varicose veins of bilateral lower extremities with pain: Secondary | ICD-10-CM | POA: Diagnosis not present

## 2020-10-17 DIAGNOSIS — I872 Venous insufficiency (chronic) (peripheral): Secondary | ICD-10-CM | POA: Diagnosis not present

## 2020-10-17 DIAGNOSIS — I82811 Embolism and thrombosis of superficial veins of right lower extremities: Secondary | ICD-10-CM | POA: Diagnosis not present

## 2020-10-17 DIAGNOSIS — R600 Localized edema: Secondary | ICD-10-CM | POA: Diagnosis not present

## 2020-10-22 DIAGNOSIS — N904 Leukoplakia of vulva: Secondary | ICD-10-CM | POA: Diagnosis not present

## 2020-10-22 DIAGNOSIS — N7689 Other specified inflammation of vagina and vulva: Secondary | ICD-10-CM | POA: Diagnosis not present

## 2020-10-22 DIAGNOSIS — L900 Lichen sclerosus et atrophicus: Secondary | ICD-10-CM | POA: Diagnosis not present

## 2020-11-14 DIAGNOSIS — L900 Lichen sclerosus et atrophicus: Secondary | ICD-10-CM | POA: Diagnosis not present

## 2020-12-10 DIAGNOSIS — I872 Venous insufficiency (chronic) (peripheral): Secondary | ICD-10-CM | POA: Insufficient documentation

## 2020-12-10 DIAGNOSIS — I83813 Varicose veins of bilateral lower extremities with pain: Secondary | ICD-10-CM | POA: Diagnosis not present

## 2020-12-18 DIAGNOSIS — I739 Peripheral vascular disease, unspecified: Secondary | ICD-10-CM | POA: Diagnosis not present

## 2020-12-18 DIAGNOSIS — E782 Mixed hyperlipidemia: Secondary | ICD-10-CM | POA: Diagnosis not present

## 2020-12-18 DIAGNOSIS — R7301 Impaired fasting glucose: Secondary | ICD-10-CM | POA: Diagnosis not present

## 2020-12-18 DIAGNOSIS — M159 Polyosteoarthritis, unspecified: Secondary | ICD-10-CM | POA: Diagnosis not present

## 2020-12-18 DIAGNOSIS — I10 Essential (primary) hypertension: Secondary | ICD-10-CM | POA: Diagnosis not present

## 2020-12-18 DIAGNOSIS — I83813 Varicose veins of bilateral lower extremities with pain: Secondary | ICD-10-CM | POA: Diagnosis not present

## 2021-02-10 IMAGING — CT CT ABD-PELV W/ CM
2 of 5 series · 15 of 46 positions shown, 17 images · IV contrast (Omnipaque)
Comparison: No priors.

CLINICAL DATA: 79-year-old female with history of abdominal
distension.

EXAM:
CT ABDOMEN AND PELVIS WITH CONTRAST
TECHNIQUE: Multidetector CT imaging of the abdomen and pelvis was performed
using the standard protocol following bolus administration of
intravenous contrast.
CONTRAST:  100mL OMNIPAQUE IOHEXOL 300 MG/ML  SOLN

[Series 2: axial st · axial · 0.81mm/px · z∈[-380,+45]mm · 12 of 95 slices shown, 14 images]
[im 5/95  soft-tissue]
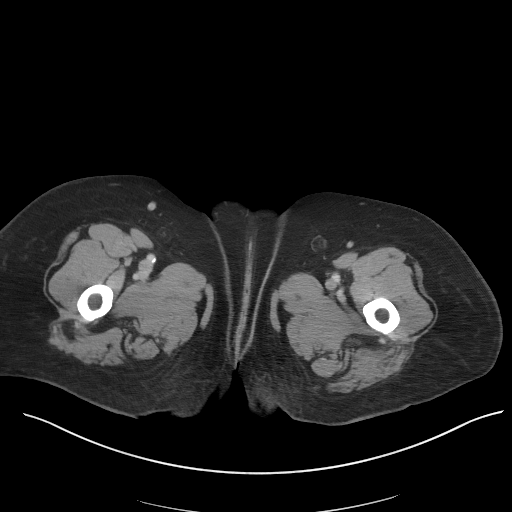
[im 5/95  bone]
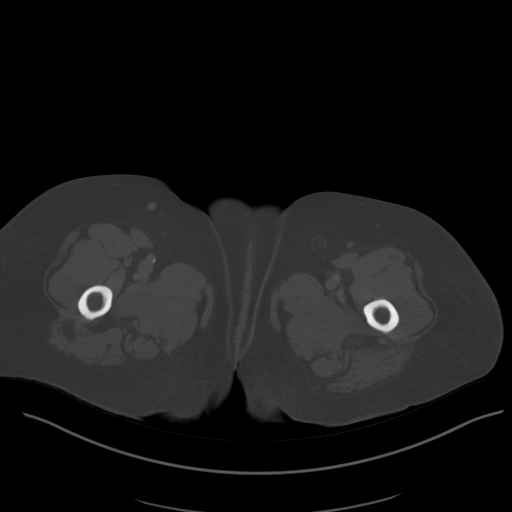
[im 15/95  soft-tissue]
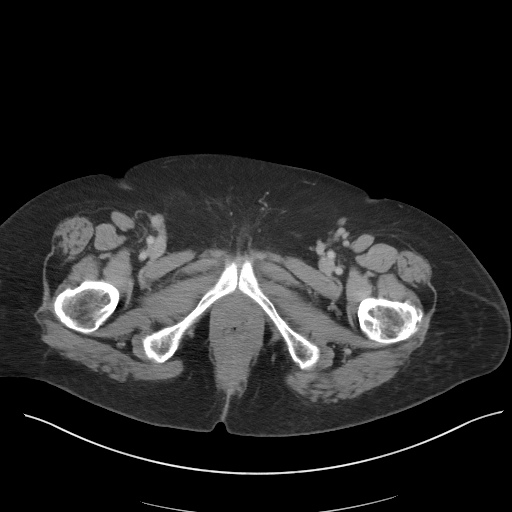
[im 19/95  soft-tissue]
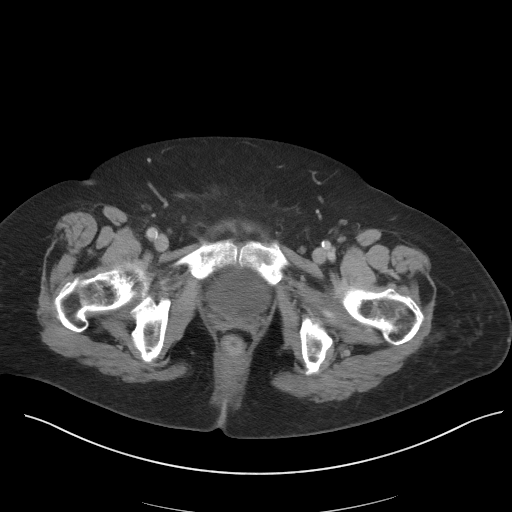
[im 29/95  soft-tissue]
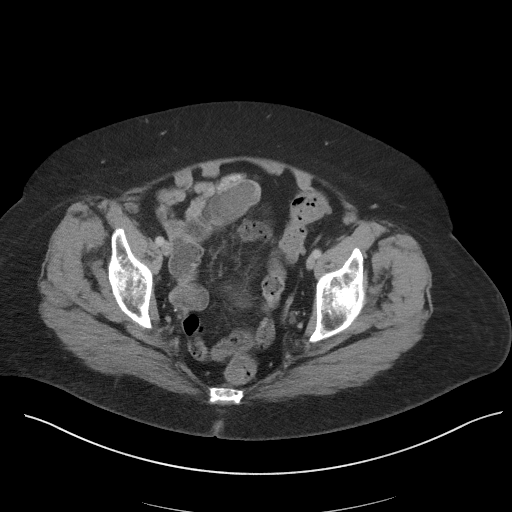
[im 38/95  soft-tissue]
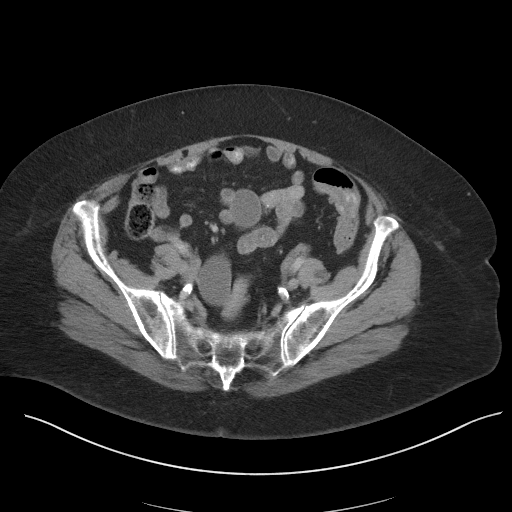
[im 43/95  soft-tissue]
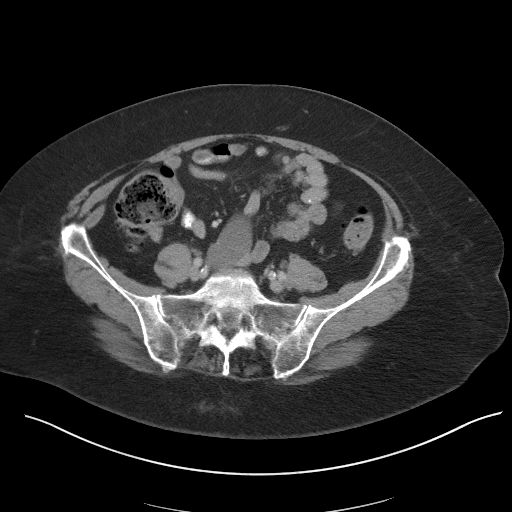
[im 52/95  soft-tissue]
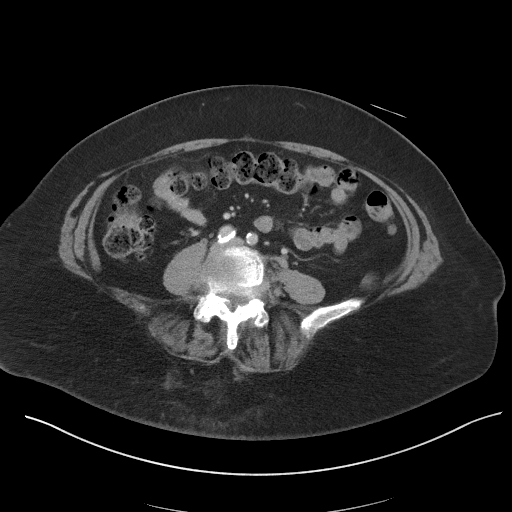
[im 57/95  soft-tissue]
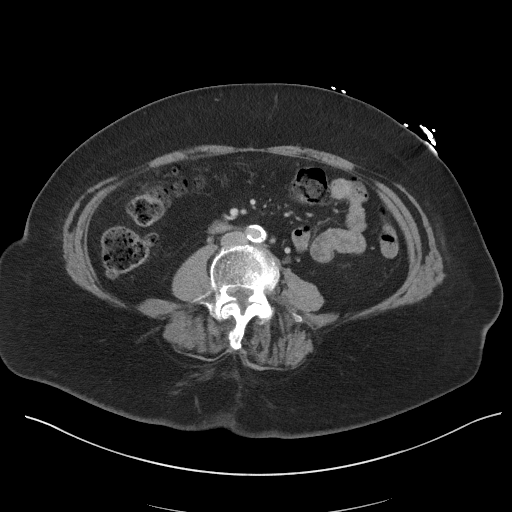
[im 66/95  soft-tissue]
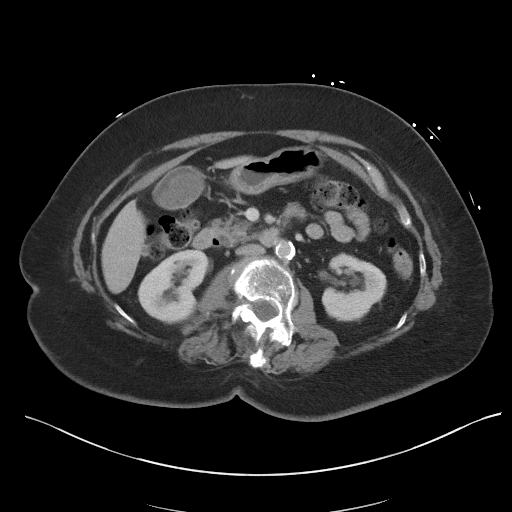
[im 66/95  bone]
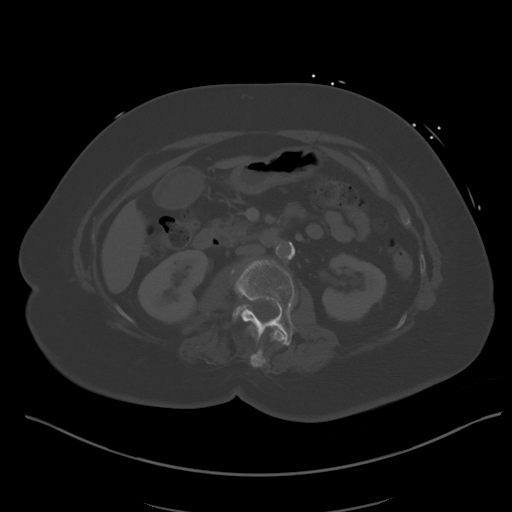
[im 76/95  soft-tissue]
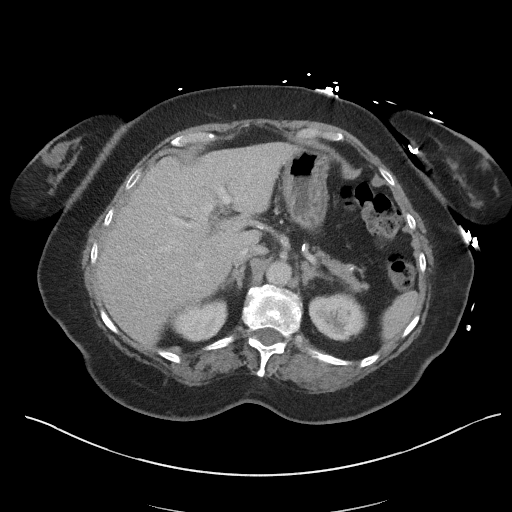
[im 80/95  soft-tissue]
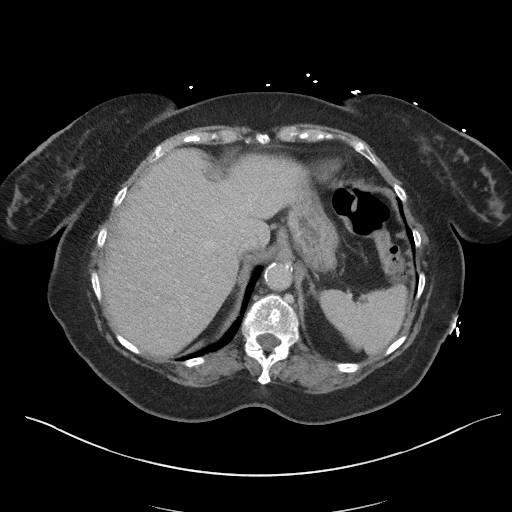
[im 90/95  soft-tissue]
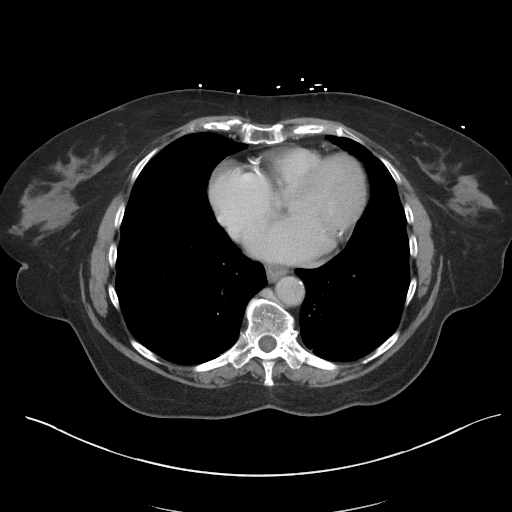

[Series 5: coronal st · coronal · 0.82mm/px · 3 of 101 slices shown]
[im 34/101  soft-tissue]
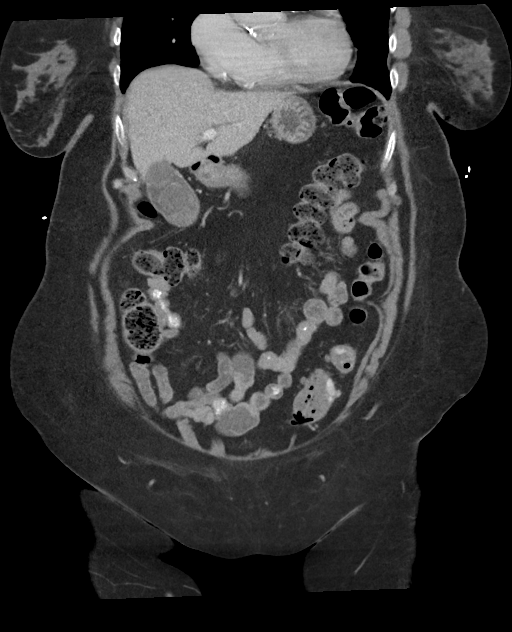
[im 45/101  soft-tissue]
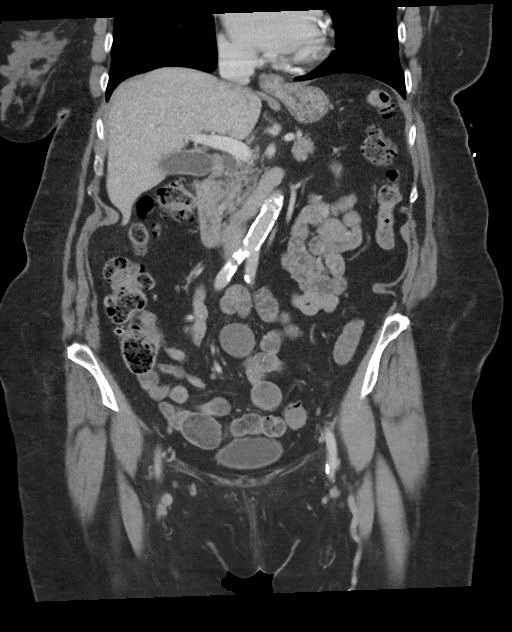
[im 56/101  soft-tissue]
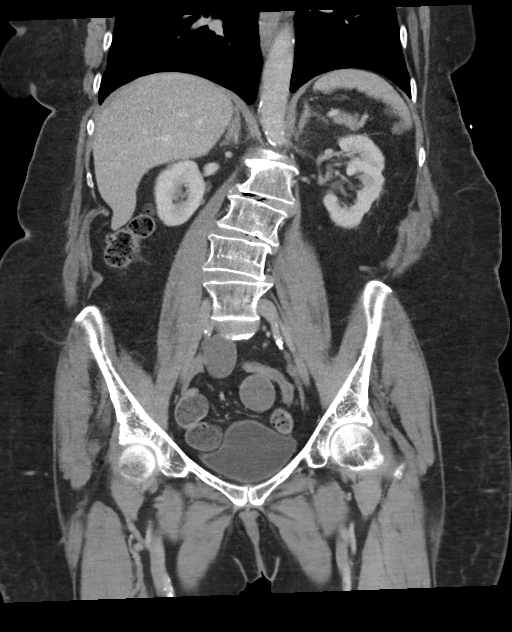

[15 of 46 positions shown; findings below may reference images not displayed]

FINDINGS: Lower chest: Aortic atherosclerosis. Atherosclerotic calcifications
in the left anterior descending, left circumflex and right coronary
arteries. Calcifications of the aortic valve. Small hiatal hernia.

Hepatobiliary: No suspicious cystic or solid hepatic lesions. No
intra or extrahepatic biliary ductal dilatation. Several
noncalcified gallstones are noted within the gallbladder measuring
up to 2.7 cm. No findings to suggest an acute cholecystitis at this
time.

Pancreas: No pancreatic mass. No pancreatic ductal dilatation. No
pancreatic or peripancreatic fluid collections or inflammatory
changes.

Spleen: Unremarkable.

Adrenals/Urinary Tract: Subcentimeter low-attenuation lesions in
both kidneys, too small to characterize, but statistically likely to
represent tiny cysts. No hydroureteronephrosis. Urinary bladder is
normal in appearance. Bilateral adrenal glands are normal in
appearance.

Stomach/Bowel: Normal appearance of the stomach. No pathologic
dilatation of small bowel or colon. A few scattered colonic
diverticulae are noted, without surrounding inflammatory changes to
suggest an acute diverticulitis at this time. Normal appendix.

Vascular/Lymphatic: Aortic atherosclerosis, without evidence of
aneurysm or dissection in the abdominal or pelvic vasculature. No
lymphadenopathy noted in the abdomen or pelvis.

Reproductive: Status post hysterectomy. Ovaries are not confidently
identified may be surgically absent or atrophic.

Other: No significant volume of ascites.  No pneumoperitoneum.

Musculoskeletal: There are no aggressive appearing lytic or blastic
lesions noted in the visualized portions of the skeleton.
IMPRESSION: 1. No acute findings are noted in the abdomen or pelvis to account
for the patient's symptoms.
2. Cholelithiasis without evidence of acute cholecystitis at this
time.
3. Colonic diverticulosis without evidence of acute diverticulitis
at this time.
4. Aortic atherosclerosis, in addition to least 3 vessel coronary
artery disease. Assessment for potential risk factor modification,
dietary therapy or pharmacologic therapy may be warranted, if
clinically indicated.
5. There are calcifications of the aortic valve. Echocardiographic
correlation for evaluation of potential valvular dysfunction may be
warranted if clinically indicated.
6. Small hiatal hernia.
7. Additional incidental findings, as above.

## 2021-03-03 DIAGNOSIS — H524 Presbyopia: Secondary | ICD-10-CM | POA: Diagnosis not present

## 2021-03-03 DIAGNOSIS — H353131 Nonexudative age-related macular degeneration, bilateral, early dry stage: Secondary | ICD-10-CM | POA: Diagnosis not present

## 2021-03-03 DIAGNOSIS — H52202 Unspecified astigmatism, left eye: Secondary | ICD-10-CM | POA: Diagnosis not present

## 2021-03-03 DIAGNOSIS — H5203 Hypermetropia, bilateral: Secondary | ICD-10-CM | POA: Diagnosis not present

## 2021-03-03 DIAGNOSIS — H2513 Age-related nuclear cataract, bilateral: Secondary | ICD-10-CM | POA: Diagnosis not present

## 2021-03-03 DIAGNOSIS — H25013 Cortical age-related cataract, bilateral: Secondary | ICD-10-CM | POA: Diagnosis not present

## 2021-03-03 DIAGNOSIS — H35371 Puckering of macula, right eye: Secondary | ICD-10-CM | POA: Diagnosis not present

## 2021-10-20 ENCOUNTER — Telehealth: Payer: Self-pay | Admitting: Cardiology

## 2021-10-20 ENCOUNTER — Other Ambulatory Visit: Payer: Self-pay

## 2021-10-20 ENCOUNTER — Other Ambulatory Visit: Payer: Self-pay | Admitting: Cardiology

## 2021-10-20 MED ORDER — NITROGLYCERIN 0.4 MG SL SUBL: SUBLINGUAL_TABLET | SUBLINGUAL | 3 refills | Status: DC

## 2021-10-20 NOTE — Telephone Encounter (Signed)
Patient says she needs a refill on Nitro  Preferred Pharmacy is [facility]  Best number to reach patient is [phone]

## 2022-01-30 ENCOUNTER — Ambulatory Visit: Payer: PPO | Admitting: Cardiology

## 2022-02-16 ENCOUNTER — Encounter: Payer: Self-pay | Admitting: Cardiology

## 2022-02-16 ENCOUNTER — Ambulatory Visit: Payer: PPO | Attending: Cardiology | Admitting: Cardiology

## 2022-02-16 VITALS — BP 138/80 | HR 64 | Ht 63.0 in | Wt 159.8 lb

## 2022-02-16 DIAGNOSIS — E7800 Pure hypercholesterolemia, unspecified: Secondary | ICD-10-CM

## 2022-02-16 DIAGNOSIS — I2510 Atherosclerotic heart disease of native coronary artery without angina pectoris: Secondary | ICD-10-CM | POA: Diagnosis not present

## 2022-02-16 DIAGNOSIS — I739 Peripheral vascular disease, unspecified: Secondary | ICD-10-CM

## 2022-02-16 DIAGNOSIS — I10 Essential (primary) hypertension: Secondary | ICD-10-CM

## 2022-02-16 DIAGNOSIS — R0609 Other forms of dyspnea: Secondary | ICD-10-CM

## 2022-02-16 DIAGNOSIS — I779 Disorder of arteries and arterioles, unspecified: Secondary | ICD-10-CM

## 2022-02-16 NOTE — Progress Notes (Signed)
Cardiology Consultation:    Date:  02/16/2022   ID:  Theresa Prince, DOB [birth year], MRN [id number]  PCP:  Manfred Shirts, PA  Cardiologist:  Jenne Campus, MD   Referring MD: Manfred Shirts, [place]   Chief Complaint  Patient presents with   Follow-up    History of Present Illness:    Theresa Prince is a 81 y.o. female who is being seen today for the evaluation of follow-up cardiac lesion at the request of Beane, Lori M, [place].  She has not been seen for more than 3 years past medical history significant for coronary artery disease in February 2019 she did have cardiac catheterization showing 75% PDA, 95% obtuse marginal branch, that was treated with PTCA, she was also find to have 90% proximal mid circumflex artery treated with drug-eluting stent additional problems identified at that time was 25% proximal LAD and less than 80% treated with drug-eluting stent.  Ejection fraction was normal at that time.  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  She denies have any chest pain tightness squeezing pressure burning chest but she does complain of having some shortness of breath fatigue and tiredness.  No palpitations no dizziness no swelling of lower extremities  Past Medical History:  Diagnosis Date   Abnormal auditory perception of both ears 08/25/2017   Acute eczematoid otitis externa of both ears 06/30/2016   Acute GI bleeding 06/04/2019   Alopecia 01/27/2014   Anxiety    Arthritis    "all over" (05/13/2017)   Bilateral impacted cerumen 08/25/2017   Bulging lumbar disc 01/27/2014   CAD in native artery    a. Cardiac cath 05/14/17 showed multivessel disease with 25% Prox-distal RCA, 75% rPDA, 95% OM2 (treated with PTCA), 90% prox-mid Cx (treated with DES), 25% prox LAD< 80%  mLAD (treated with DES), 50% mid-distal LAD, 80% distal LAD, LVEDP normal, LVEF 55-65%.   Cancer ([place])    cervical (pre-cancerous)   Cerebrovascular accident (CVA) due to occlusion of left middle cerebral artery ([place]) 02/27/2016   Chronic lower back pain    Clumsiness 02/27/2016   Degenerative arthritis 01/27/2014   Depression    Diverticulosis    Elevated transaminase level 06/14/2018   Generalized osteoarthritis of multiple sites 07/17/2014   High cholesterol    History of non-ST elevation myocardial infarction (NSTEMI) 01/12/2019   Formatting of this note might be different from the original. 05/13/2017 [facility]mission Note   History of stroke without residual deficits 07/17/2014   Hypertension    Ingrown nail 75/01/2584   Lichen sclerosus 27/78/2423   Lower GI bleed 06/04/2019   Mixed emotional features as adjustment reaction 01/27/2014   Nasal congestion 06/30/2016   Nasal vestibulitis 06/30/2016   Neuralgia and neuritis 01/27/2014   Overview:  Overview:  Right anterior thigh Overview:  Right anterior thigh   NSTEMI (non-ST elevated myocardial infarction) ([place]) 05/13/2017   Osteopenia 01/27/2014   PAD (peripheral artery disease) ([place])    a. CT 04/2017 showed peripheral calcified and noncalcified atherosclerotic plaque with atherosclerotic narrowing of the splenic artery and atherosclerotic narrowing   Pain in toes of both feet 03/17/2017   Perennial allergic rhinitis with seasonal variation 07/17/2014   Perioral dermatitis 12/02/2016   Pes anserine bursitis 06/20/2019   PFO (patent foramen ovale)    a. noted  on echo 04/2017.   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   Vitamin D deficiency 01/27/2014    Past Surgical History:  Procedure Laterality Date   BACK SURGERY     BIOPSY  06/06/2019   Procedure: BIOPSY;  Surgeon: Otis Brace, MD;  Location: [facility];  Service: Gastroenterology;;   COLONOSCOPY WITH PROPOFOL N/A 06/06/2019   Procedure: COLONOSCOPY WITH PROPOFOL;  Surgeon: Otis Brace, MD;  Location: [facility];  Service: Gastroenterology;  Laterality: N/A;   CORONARY BALLOON ANGIOPLASTY N/A 05/14/2017   Procedure: CORONARY BALLOON ANGIOPLASTY;  Surgeon: Jettie Booze, MD;  Location: [place] [facility];  Service: Cardiovascular;  Laterality: N/A;  OM2   CORONARY STENT INTERVENTION N/A 05/14/2017   Procedure: CORONARY STENT INTERVENTION;  Surgeon: Jettie Booze, MD;  Location: [place] [facility];  Service: Cardiovascular;  Laterality: N/A;   CYST EXCISION  1999   "took off my spine"   LEFT HEART CATH AND CORONARY ANGIOGRAPHY N/A 05/14/2017   Procedure: LEFT HEART CATH AND CORONARY ANGIOGRAPHY;  Surgeon: Jettie Booze, MD;  Location: [place] [facility];  Service: Cardiovascular;  Laterality: N/A;   POLYPECTOMY  06/06/2019   Procedure: POLYPECTOMY;  Surgeon: Otis Brace, MD;  Location: [facility];  Service: Gastroenterology;;   VAGINAL HYSTERECTOMY  1982    Current Medications: Current Meds  Medication Sig   amLODipine (NORVASC) 10 MG tablet Take 10 mg by mouth daily.    Ascorbic Acid (VITAMIN C WITH ROSE HIPS) 500 MG tablet Take 500 mg by mouth daily.   aspirin EC 81 MG tablet Take 81 mg by mouth daily.   augmented betamethasone dipropionate (DIPROLENE-AF) 0.05 % cream Apply 1 application  topically 2 (two) times daily.   calcium carbonate (TUMS - DOSED IN MG ELEMENTAL CALCIUM) 500 MG chewable tablet Chew 2 tablets by mouth 2 (two) times daily as needed for indigestion or heartburn.   Calcium Citrate-Vitamin D  (CALCIUM CITRATE+D3 PETITES PO) Take 1 tablet by mouth in the morning and at bedtime.    cholecalciferol (VITAMIN D) 1000 units tablet Take 1,000 Units by mouth daily.   diazepam (VALIUM) 10 MG tablet Take 5-10 mg by mouth every 6 (six) hours as needed for anxiety.   diclofenac Sodium (VOLTAREN) 1 % GEL Apply 2 g topically 4 (four) times daily as needed (joint pain).   docusate sodium (COLACE) 100 MG capsule Take 200 mg by mouth at bedtime.   enalapril (VASOTEC) 20 MG tablet Take 20 mg by mouth daily.   famotidine (PEPCID) 40 MG tablet Take 40 mg by mouth daily.   fluticasone (FLONASE) 50 MCG/ACT nasal spray Place 1 spray into both nostrils daily as needed for rhinitis.   furosemide (LASIX) 20 MG tablet Take 20 mg by mouth 2 (two) times daily as needed for edema or fluid.   HYDROcodone-acetaminophen (NORCO/VICODIN) 5-325 MG tablet Take 1 tablet by mouth 2 (two) times daily.   hydrocortisone (ANUSOL-HC) 25 MG suppository Place 25 mg rectally 2 (two) times daily as needed for hemorrhoids or anal itching.   loratadine (CLARITIN) 10 MG tablet Take 10 mg by  mouth daily. Only takes in Summer   metoprolol succinate (TOPROL-XL) 25 MG 24 hr tablet Take 25 mg by mouth daily.   metroNIDAZOLE (METROGEL) 0.75 % gel Apply 1 Application topically 2 (two) times daily.   montelukast (SINGULAIR) 10 MG tablet Take 10 mg by mouth at bedtime.   Multiple Vitamins-Minerals (MULTIVITAMIN WITH MINERALS) tablet Take 1 tablet by mouth daily.    naproxen (NAPROSYN) 500 MG tablet Take 500 mg by mouth 2 (two) times daily with a meal.   nitroGLYCERIN (NITROSTAT) 0.4 MG SL tablet DISSOLVE ONE TABLET UNDER TONGUE EVERY 5 MINUTES AS NEEDED UP TO 3 DOSES, IF NORELIEF CALL 911 (Patient taking differently: Place 0.4 mg under the tongue every 5 (five) minutes as needed for chest pain.)   nitroGLYCERIN (NITROSTAT) 0.4 MG SL tablet DISSOLVE ONE TABLET UNDER TONGUE EVERY 5 MINUTES AS NEEDED UP TO 3 DOSES. IF NORELIEF CALL 911.   Omega-3  Krill Oil 500 MG CAPS Take 500 mg by mouth daily.    polyvinyl alcohol (LIQUIFILM TEARS) 1.4 % ophthalmic solution Place 1 drop into both eyes as needed for dry eyes.   rosuvastatin (CRESTOR) 20 MG tablet Take 20 mg by mouth See admin instructions. Three times a week on Tuesday, Wednesday and Saturday   solifenacin (VESICARE) 10 MG tablet Take 10 mg by mouth daily.   tiZANidine (ZANAFLEX) 4 MG capsule Take 4 mg by mouth 3 (three) times daily as needed for muscle spasms.   Zinc 50 MG TABS Take 50 mg by mouth daily.   [DISCONTINUED] ketoconazole (NIZORAL) 2 % shampoo Apply 1 application topically 3 (three) times a week.      Allergies:   Hydralazine, Codeine, Fosamax [alendronate sodium], and Simvastatin   Social History   Socioeconomic History   Marital status: Widowed    Spouse name: Not on file   Number of children: Not on file   Years of education: Not on file   Highest education level: Not on file  Occupational History   Not on file  Tobacco Use   Smoking status: Never   Smokeless tobacco: Never  Vaping Use   Vaping Use: Never used  Substance and Sexual Activity   Alcohol use: No   Drug use: No   Sexual activity: Never    Birth control/protection: Surgical  Other Topics Concern   Not on file  Social History Narrative   Not on file   Social Determinants of Health   Financial Resource Strain: Not on file  Food Insecurity: Not on file  Transportation Needs: Not on file  Physical Activity: Not on file  Stress: Not on file  Social Connections: Not on file     Family History: The patient's Family history is unknown by patient. ROS:   Please see the history of present illness.    All 14 point review of systems negative except as described per history of present illness.  EKGs/Labs/Other Studies Reviewed:    The following studies were reviewed today:   EKG:  EKG is  ordered today.  The ekg ordered today demonstrates normal sinus rhythm, normal P interval, normal QS  complex duration morphology no ST segment changes  Recent Labs: No results found for requested labs within last 365 days.  Recent Lipid Panel    Component Value Date/Time   CHOL 134 06/22/2017 1522   TRIG 76 06/22/2017 1522   HDL 48 06/22/2017 1522   CHOLHDL 2.8 06/22/2017 1522   CHOLHDL 4.9 05/14/2017 0247   VLDL 18 05/14/2017  0247   LDLCALC 71 06/22/2017 1522    Physical Exam:    VS:  BP 138/80 (BP Location: Left Arm, Patient Position: Sitting)   Pulse 64   Ht '5\' 3"'$  (1.6 m)   Wt 159 lb 12.8 oz (72.5 kg)   SpO2 97%   BMI 28.31 kg/m     Wt Readings from Last 3 Encounters:  02/16/22 159 lb 12.8 oz (72.5 kg)  06/06/19 179 lb 14.3 oz (81.6 kg)  11/18/18 182 lb (82.6 kg)     GEN:  Well nourished, well developed in no acute distress HEENT: Normal NECK: No JVD; No carotid bruits LYMPHATICS: No lymphadenopathy CARDIAC: RRR, no murmurs, no rubs, no gallops RESPIRATORY:  Clear to auscultation without rales, wheezing or rhonchi  ABDOMEN: Soft, non-tender, non-distended MUSCULOSKELETAL:  No edema; No deformity  SKIN: Warm and dry NEUROLOGIC:  Alert and oriented x 3 PSYCHIATRIC:  Normal affect   ASSESSMENT:    1. CAD in native artery   2. PAD (peripheral artery disease) ([place])   3. Primary hypertension   4. High cholesterol    PLAN:    In order of problems listed above:  Coronary disease stable from that point review.  Denies have any signs and symptoms of reactivation of the problem.  We will continue present therapy which include antiplatelets therapy as well as statin Peripheral vascular disease.  I will ask her to have carotic ultrasounds to check on her and carotic artery used to be stenosis up to 40% based on last carotid ultrasound done in 2019 Essential: Blood pressure seems to well controlled we will continue present management Dyslipidemia I did review her K PN which show me LDL 71 HDL 48 however this is from March 2019 she tells me that her primary care  physician stopped her Crestor because of side effects and then she started gently only 3 times a week and that is what she takes right now, will check her cholesterol see where we are We did talk about healthy lifestyle need to exercise on the regular basis and be active which she is trying to do   Medication Adjustments/Labs and Tests Ordered: Current medicines are reviewed at length with the patient today.  Concerns regarding medicines are outlined above.  No orders of the defined types were placed in this encounter.  No orders of the defined types were placed in this encounter.   Signed, Park Liter, MD, [facility]. 02/16/2022 4:06 PM    [facility]

## 2022-02-16 NOTE — Addendum Note (Signed)
Addended by: Truddie Hidden on: 02/16/2022 04:24 PM   Modules accepted: Orders

## 2022-02-16 NOTE — Addendum Note (Signed)
Addended by: Truddie Hidden on: 02/16/2022 04:17 PM   Modules accepted: Orders

## 2022-02-16 NOTE — Patient Instructions (Signed)
Medication Instructions:  Your physician recommends that you continue on your current medications as directed. Please refer to the Current Medication list given to you today.  *If you need a refill on your cardiac medications before your next appointment, please call your pharmacy*   Lab Work: None ordered If you have labs (blood work) drawn today and your tests are completely normal, you will receive your results only by: [place] (if you have MyChart) OR A paper copy in the mail If you have any lab test that is abnormal or we need to change your treatment, we will call you to review the results.   Testing/Procedures: Your physician has requested that you have an echocardiogram. Echocardiography is a painless test that uses sound waves to create images of your heart. It provides your doctor with information about the size and shape of your heart and how well your heart's chambers and valves are working. This procedure takes approximately one hour. There are no restrictions for this procedure.  Your physician has requested that you have a carotid duplex. This test is an ultrasound of the carotid arteries in your neck. It looks at blood flow through these arteries that supply the brain with blood. Allow one hour for this exam. There are no restrictions or special instructions.  Follow-Up: At [facility], you and your health needs are our priority.  As part of our continuing mission to provide you with exceptional heart care, we have created designated Provider Care Teams.  These Care Teams include your primary Cardiologist (physician) and Advanced Practice Providers (APPs -  Physician Assistants and Nurse Practitioners) who all work together to provide you with the care you need, when you need it.  We recommend signing up for the patient portal called "MyChart".  Sign up information is provided on this After Visit Summary.  MyChart is used to connect with patients for Virtual Visits  (Telemedicine).  Patients are able to view lab/test results, encounter notes, upcoming appointments, etc.  Non-urgent messages can be sent to your provider as well.   To learn more about what you can do with MyChart, go to [email or web address].    Your next appointment:   6 month(s)  The format for your next appointment:   In Person  Provider:   Jenne Campus, MD   Other Instructions Echocardiogram An echocardiogram is a test that uses sound waves (ultrasound) to produce images of the heart. Images from an echocardiogram can provide important information about: Heart size and shape. The size and thickness and movement of your heart's walls. Heart muscle function and strength. Heart valve function or if you have stenosis. Stenosis is when the heart valves are too narrow. If blood is flowing backward through the heart valves (regurgitation). A tumor or infectious growth around the heart valves. Areas of heart muscle that are not working well because of poor blood flow or injury from a heart attack. Aneurysm detection. An aneurysm is a weak or damaged part of an artery wall. The wall bulges out from the normal force of blood pumping through the body. Tell a health care provider about: Any allergies you have. All medicines you are taking, including vitamins, herbs, eye drops, creams, and over-the-counter medicines. Any blood disorders you have. Any surgeries you have had. Any medical conditions you have. Whether you are pregnant or may be pregnant. What are the risks? Generally, this is a safe test. However, problems may occur, including an allergic reaction to dye (contrast) that may be used  during the test. What happens before the test? No specific preparation is needed. You may eat and drink normally. What happens during the test? You will take off your clothes from the waist up and put on a hospital gown. Electrodes or electrocardiogram (ECG)patches may be placed on your  chest. The electrodes or patches are then connected to a device that monitors your heart rate and rhythm. You will lie down on a table for an ultrasound exam. A gel will be applied to your chest to help sound waves pass through your skin. A handheld device, called a transducer, will be pressed against your chest and moved over your heart. The transducer produces sound waves that travel to your heart and bounce back (or "echo" back) to the transducer. These sound waves will be captured in real-time and changed into images of your heart that can be viewed on a video monitor. The images will be recorded on a computer and reviewed by your health care provider. You may be asked to change positions or hold your breath for a short time. This makes it easier to get different views or better views of your heart. In some cases, you may receive contrast through an IV in one of your veins. This can improve the quality of the pictures from your heart. The procedure may vary among health care providers and hospitals.   What can I expect after the test? You may return to your normal, everyday life, including diet, activities, and medicines, unless your health care provider tells you not to do that. Follow these instructions at home: It is up to you to get the results of your test. Ask your health care provider, or the department that is doing the test, when your results will be ready. Keep all follow-up visits. This is important. Summary An echocardiogram is a test that uses sound waves (ultrasound) to produce images of the heart. Images from an echocardiogram can provide important information about the size and shape of your heart, heart muscle function, heart valve function, and other possible heart problems. You do not need to do anything to prepare before this test. You may eat and drink normally. After the echocardiogram is completed, you may return to your normal, everyday life, unless your health care  provider tells you not to do that. This information is not intended to replace advice given to you by your health care provider. Make sure you discuss any questions you have with your health care provider. Document Revised: 11/21/2019 Document Reviewed: 11/21/2019 Elsevier Patient Education  2021 [employer].

## 2022-02-17 LAB — LDL CHOLESTEROL, DIRECT: LDL Direct: 82 mg/dL (ref 0–99)

## 2022-02-24 ENCOUNTER — Telehealth: Payer: Self-pay

## 2022-02-24 NOTE — Telephone Encounter (Signed)
LVM

## 2022-03-09 ENCOUNTER — Ambulatory Visit (INDEPENDENT_AMBULATORY_CARE_PROVIDER_SITE_OTHER): Payer: PPO

## 2022-03-09 ENCOUNTER — Ambulatory Visit: Payer: PPO | Attending: Cardiology

## 2022-03-09 DIAGNOSIS — I779 Disorder of arteries and arterioles, unspecified: Secondary | ICD-10-CM

## 2022-03-09 DIAGNOSIS — R0609 Other forms of dyspnea: Secondary | ICD-10-CM

## 2022-03-09 LAB — ECHOCARDIOGRAM COMPLETE
Area-P 1/2: 3.13 cm2
P 1/2 time: 603 msec
S' Lateral: 2.9 cm

## 2022-03-10 ENCOUNTER — Telehealth: Payer: Self-pay

## 2022-03-10 NOTE — Telephone Encounter (Signed)
Att to call pt and DPR- Isabelle Course- Pt number busy, DPR number no VM

## 2022-03-13 ENCOUNTER — Telehealth: Payer: Self-pay

## 2022-03-13 NOTE — Telephone Encounter (Signed)
Results reviewed with pt as per Dr. Krasowski's note.  Pt verbalized understanding and had no additional questions. Routed to PCP  

## 2022-03-18 ENCOUNTER — Telehealth: Payer: Self-pay

## 2022-03-18 DIAGNOSIS — I2510 Atherosclerotic heart disease of native coronary artery without angina pectoris: Secondary | ICD-10-CM

## 2022-03-18 DIAGNOSIS — E7800 Pure hypercholesterolemia, unspecified: Secondary | ICD-10-CM

## 2022-03-18 DIAGNOSIS — R739 Hyperglycemia, unspecified: Secondary | ICD-10-CM

## 2022-03-18 MED ORDER — ROSUVASTATIN CALCIUM 20 MG PO TABS: 20.0000 mg | ORAL_TABLET | Freq: Every day | ORAL | 3 refills | Status: DC

## 2022-03-18 NOTE — Telephone Encounter (Signed)
-----   Message from Park Liter, MD sent at 03/16/2022 10:45 AM EST ----- , Faythe Ghee in this case lets take Crestor every single day and lets check liver function test and hemoglobin A1c within the next 3 weeks ----- Message ----- From: Tyler Pita, RN Sent: 03/13/2022   1:40 PM EST To: Park Liter, MD  Called pt with lab results. She stated that she is taking Crestor '20mg'$  4 x a week due to her liver function test. She is also concerned Crestor may be making her A1C increase as well. Please advise.

## 2022-10-29 ENCOUNTER — Other Ambulatory Visit: Payer: Self-pay | Admitting: Cardiology

## 2023-01-14 DIAGNOSIS — R8769 Abnormal cytological findings in specimens from other female genital organs: Secondary | ICD-10-CM | POA: Insufficient documentation

## 2023-01-25 DIAGNOSIS — L439 Lichen planus, unspecified: Secondary | ICD-10-CM | POA: Insufficient documentation

## 2023-01-25 DIAGNOSIS — K219 Gastro-esophageal reflux disease without esophagitis: Secondary | ICD-10-CM | POA: Insufficient documentation

## 2023-01-25 DIAGNOSIS — H15109 Unspecified episcleritis, unspecified eye: Secondary | ICD-10-CM | POA: Insufficient documentation

## 2023-01-25 DIAGNOSIS — H353131 Nonexudative age-related macular degeneration, bilateral, early dry stage: Secondary | ICD-10-CM | POA: Insufficient documentation

## 2023-01-25 DIAGNOSIS — H35371 Puckering of macula, right eye: Secondary | ICD-10-CM | POA: Insufficient documentation

## 2023-01-25 DIAGNOSIS — Z860101 Personal history of adenomatous and serrated colon polyps: Secondary | ICD-10-CM | POA: Insufficient documentation

## 2023-01-25 DIAGNOSIS — H269 Unspecified cataract: Secondary | ICD-10-CM | POA: Insufficient documentation

## 2023-01-26 ENCOUNTER — Encounter: Payer: Self-pay | Admitting: Cardiology

## 2023-01-26 ENCOUNTER — Ambulatory Visit: Payer: PPO | Attending: Cardiology | Admitting: Cardiology

## 2023-01-26 VITALS — BP 164/62 | HR 61 | Ht 63.0 in | Wt 159.6 lb

## 2023-01-26 DIAGNOSIS — E7800 Pure hypercholesterolemia, unspecified: Secondary | ICD-10-CM

## 2023-01-26 DIAGNOSIS — Z01810 Encounter for preprocedural cardiovascular examination: Secondary | ICD-10-CM

## 2023-01-26 DIAGNOSIS — I10 Essential (primary) hypertension: Secondary | ICD-10-CM

## 2023-01-26 DIAGNOSIS — I2510 Atherosclerotic heart disease of native coronary artery without angina pectoris: Secondary | ICD-10-CM | POA: Diagnosis not present

## 2023-01-26 DIAGNOSIS — I739 Peripheral vascular disease, unspecified: Secondary | ICD-10-CM | POA: Diagnosis not present

## 2023-01-26 NOTE — Progress Notes (Signed)
Cardiology Office Note:    Date:  01/26/2023   ID:  Theresa Prince, DOB 08/26/1940, MRN [id number]  PCP:  Shellia Cleverly, PA  Cardiologist:  Gypsy Balsam, MD    Referring MD: Shellia Cleverly, [place]   Chief Complaint  Patient presents with   Medical Clearance    History of Present Illness:    Theresa Prince is a 82 y.o. female   who is being seen today for the evaluation of follow-up cardiac lesion at the request of Beane, Lori M, [place].  She has not been seen for more than 3 years past medical history significant for coronary artery disease in February 2019 she did have cardiac catheterization showing 75% PDA, 95% obtuse marginal branch, that was treated with PTCA, she was also find to have 90% proximal mid circumflex artery treated with drug-eluting stent additional problems identified at that time was 25% proximal LAD and less than 80% treated with drug-eluting stent.  Ejection fraction was normal at that time.  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  She is scheduled to have a gynecological procedure done look like under spinal anesthesia.  She denies have any cardiac complaint but admits also that she is not too active.  Another issue is her blood pressure is still not well-controlled.  Past Medical History:  Diagnosis Date   Abnormal auditory perception of both ears 08/25/2017   Acute eczematoid otitis externa of both ears 06/30/2016   Acute GI bleeding 06/04/2019   Alopecia 01/27/2014   Anxiety    Arthritis    "all over" (05/13/2017)   Bilateral impacted cerumen 08/25/2017   Bulging lumbar disc 01/27/2014   CAD in native artery    a. Cardiac cath 05/14/17 showed multivessel disease with 25% Prox-distal RCA, 75% rPDA, 95% OM2 (treated with PTCA), 90% prox-mid Cx  (treated with DES), 25% prox LAD< 80% mLAD (treated with DES), 50% mid-distal LAD, 80% distal LAD, LVEDP normal, LVEF 55-65%.   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 05/13/2017 [facility] Admission Note   History of stroke without residual deficits 07/17/2014   Hypertension    Ingrown nail 03/17/2017   Lichen sclerosus 01/27/2014   Lower GI bleed 06/04/2019   Mixed emotional features as adjustment reaction 01/27/2014   Nasal congestion 06/30/2016   Nasal vestibulitis 06/30/2016   Neuralgia and neuritis 01/27/2014   Overview:  Overview:  Right anterior thigh Overview:  Right anterior thigh   NSTEMI (non-ST elevated myocardial infarction) (HCC) 05/13/2017   Osteopenia 01/27/2014   PAD (peripheral artery disease) (HCC)    a. CT 04/2017 showed peripheral calcified and noncalcified atherosclerotic plaque with atherosclerotic narrowing of the splenic artery and atherosclerotic narrowing   Pain in toes of both feet 03/17/2017   Perennial allergic rhinitis with seasonal variation 07/17/2014   Perioral dermatitis 12/02/2016   Pes anserine bursitis  06/20/2019   PFO (patent foramen ovale)    a. noted on echo 04/2017.   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   Vitamin D deficiency 01/27/2014    Past Surgical History:  Procedure Laterality Date   BACK SURGERY     BIOPSY  06/06/2019   Procedure: BIOPSY;  Surgeon: Kathi Der, MD;  Location: [facility] ENDOSCOPY;  Service: Gastroenterology;;   COLONOSCOPY WITH PROPOFOL N/A 06/06/2019   Procedure: COLONOSCOPY WITH PROPOFOL;  Surgeon: Kathi Der, MD;  Location: [facility] ENDOSCOPY;  Service: Gastroenterology;  Laterality: N/A;   CORONARY BALLOON ANGIOPLASTY N/A 05/14/2017   Procedure: CORONARY BALLOON ANGIOPLASTY;  Surgeon: Corky Crafts, MD;  Location[facility] INVASIVE CV LAB;  Service: Cardiovascular;  Laterality: N/A;  OM2   CORONARY STENT INTERVENTION N/A 05/14/2017   Procedure: CORONARY STENT INTERVENTION;  Surgeon: Corky Crafts, MD;  Location: [facility] INVASIVE CV LAB;  Service: Cardiovascular;  Laterality: N/A;   CYST EXCISION  1999   "took off my spine"   LEFT HEART CATH AND CORONARY ANGIOGRAPHY N/A 05/14/2017   Procedure: LEFT HEART CATH AND CORONARY ANGIOGRAPHY;  Surgeon: Corky Crafts, MD;  Location: [facility] INVASIVE CV LAB;  Service: Cardiovascular;  Laterality: N/A;   POLYPECTOMY  06/06/2019   Procedure: POLYPECTOMY;  Surgeon: Kathi Der, MD;  Location: [facility]OS[facility];  Service: Gastroenterology;;   VAGINAL HYSTERECTOMY  1982    Current Medications: Current Meds  Medication Sig   amLODipine (NORVASC) 10 MG tablet Take 10 mg by mouth daily.    Ascorbic Acid (VITAMIN C WITH ROSE HIPS) 500 MG tablet Take 500 mg by mouth daily.   aspirin EC 81 MG tablet Take 81 mg by mouth daily.   augmented betamethasone dipropionate (DIPROLENE-AF) 0.05 % cream Apply 1 application  topically 2 (two) times daily.   calcium carbonate (TUMS - DOSED IN MG ELEMENTAL CALCIUM) 500 MG chewable tablet Chew 2 tablets by mouth 2 (two) times daily as needed for indigestion or  heartburn.   Calcium Citrate-Vitamin D (CALCIUM CITRATE+D3 PETITES PO) Take 1 tablet by mouth in the morning and at bedtime.    cholecalciferol (VITAMIN D) 1000 units tablet Take 1,000 Units by mouth daily.   diazepam (VALIUM) 10 MG tablet Take 5-10 mg by mouth every 6 (six) hours as needed for anxiety.   diclofenac Sodium (VOLTAREN) 1 % GEL Apply 2 g topically 4 (four) times daily as needed (joint pain).   docusate sodium (COLACE) 100 MG capsule Take 200 mg by mouth at bedtime.   enalapril (VASOTEC) 20 MG tablet Take 20 mg by mouth daily.   famotidine (PEPCID) 40 MG tablet Take 40 mg by mouth daily.   fluticasone (FLONASE) 50 MCG/ACT nasal spray Place 1 spray into both nostrils daily as needed for rhinitis.   furosemide (LASIX) 20 MG tablet Take 20 mg by mouth 2 (two) times daily as needed for edema or fluid.   HYDROcodone-acetaminophen (NORCO/VICODIN) 5-325 MG tablet Take 1 tablet by mouth 2 (two) times daily.   hydrocortisone (ANUSOL-HC) 25 MG suppository Place 25 mg rectally 2 (two) times daily as needed for hemorrhoids or anal  itching.   loratadine (CLARITIN) 10 MG tablet Take 10 mg by mouth daily. Only takes in Summer   metoprolol succinate (TOPROL-XL) 25 MG 24 hr tablet Take 25 mg by mouth daily.   metroNIDAZOLE (METROGEL) 0.75 % gel Apply 1 Application topically 2 (two) times daily.   montelukast (SINGULAIR) 10 MG tablet Take 10 mg by mouth at bedtime.   Multiple Vitamins-Minerals (MULTIVITAMIN WITH MINERALS) tablet Take 1 tablet by mouth daily.    naproxen (NAPROSYN) 500 MG tablet Take 500 mg by mouth 2 (two) times daily with a meal.   nitroGLYCERIN (NITROSTAT) 0.4 MG SL tablet DISSOLVE ONE TABLET UNDER TONGUE EVERY 5 MINUTES AS NEEDED UP TO 3 DOSES, IF NORELIEF CALL 911 (Patient taking differently: Place 0.4 mg under the tongue every 5 (five) minutes as needed for chest pain.)   nitroGLYCERIN (NITROSTAT) 0.4 MG SL tablet Place 1 tablet (0.4 mg total) under the tongue every 5 (five)  minutes as needed for chest pain.   Omega-3 Krill Oil 500 MG CAPS Take 500 mg by mouth daily.    polyvinyl alcohol (LIQUIFILM TEARS) 1.4 % ophthalmic solution Place 1 drop into both eyes as needed for dry eyes.   rosuvastatin (CRESTOR) 20 MG tablet Take 1 tablet (20 mg total) by mouth daily. Three times a week on Tuesday, Wednesday and Saturday   solifenacin (VESICARE) 10 MG tablet Take 10 mg by mouth daily.   tiZANidine (ZANAFLEX) 4 MG capsule Take 4 mg by mouth 3 (three) times daily as needed for muscle spasms.   Zinc 50 MG TABS Take 50 mg by mouth daily.     Allergies:   Hydralazine, Codeine, Fosamax [alendronate sodium], and Simvastatin   Social History   Socioeconomic History   Marital status: Widowed    Spouse name: Not on file   Number of children: Not on file   Years of education: Not on file   Highest education level: Not on file  Occupational History   Not on file  Tobacco Use   Smoking status: Never   Smokeless tobacco: Never  Vaping Use   Vaping status: Never Used  Substance and Sexual Activity   Alcohol use: No   Drug use: No   Sexual activity: Never    Birth control/protection: Surgical  Other Topics Concern   Not on file  Social History Narrative   Not on file   Social Determinants of Health   Financial Resource Strain: Not on file  Food Insecurity: Not on file  Transportation Needs: Not on file  Physical Activity: Not on file  Stress: Not on file  Social Connections: Not on file     Family History: The patient's Family history is unknown by patient. ROS:   Please see the history of present illness.    All 14 point review of systems negative except as described per history of present illness  EKGs/Labs/Other Studies Reviewed:    EKG Interpretation Date/Time:  Tuesday January 26 2023 15:31:54 EDT Ventricular Rate:  61 PR Interval:  146 QRS Duration:  74 QT Interval:  422 QTC Calculation: 424 R Axis:   60  Text Interpretation: Normal sinus  rhythm Normal ECG When compared with ECG of 15-May-2017 03:58, Nonspecific T wave abnormality no longer evident in Lateral leads Confirmed by Gypsy Balsam [phone]) on 01/26/2023 3:42:08 PM    Recent Labs: No results found for requested labs within last 365 days.  Recent Lipid Panel    Component Value Date/Time   CHOL 134 06/22/2017 1522  TRIG 76 06/22/2017 1522   HDL 48 06/22/2017 1522   CHOLHDL 2.8 06/22/2017 1522   CHOLHDL 4.9 05/14/2017 0247   VLDL 18 05/14/2017 0247   LDLCALC 71 06/22/2017 1522   LDLDIRECT 82 02/16/2022 1627    Physical Exam:    VS:  BP (!) 164/62 (BP Location: Left Arm, Patient Position: Sitting)   Pulse 61   Ht 5\' 3"  (1.6 m)   Wt 159 lb 9.6 oz (72.4 kg)   SpO2 98%   BMI 28.27 kg/m     Wt Readings from Last 3 Encounters:  01/26/23 159 lb 9.6 oz (72.4 kg)  02/16/22 159 lb 12.8 oz (72.5 kg)  06/06/19 179 lb 14.3 oz (81.6 kg)     GEN:  Well nourished, well developed in no acute distress HEENT: Normal NECK: No JVD; No carotid bruits LYMPHATICS: No lymphadenopathy CARDIAC: RRR, no murmurs, no rubs, no gallops RESPIRATORY:  Clear to auscultation without rales, wheezing or rhonchi  ABDOMEN: Soft, non-tender, non-distended MUSCULOSKELETAL:  No edema; No deformity  SKIN: Warm and dry LOWER EXTREMITIES: no swelling NEUROLOGIC:  Alert and oriented x 3 PSYCHIATRIC:  Normal affect   ASSESSMENT:    1. Primary hypertension   2. CAD in native artery   3. PAD (peripheral artery disease) (HCC)    PLAN:    In order of problems listed above:  Coronary artery disease multiple lesions stented years ago.  She is scheduled to have surgery under spinal anesthesia.  Her ability to exercise is limited.  I will schedule her to have a stress test make sure she does have any inducible ischemia. Peripheral vascular disease in form of carotic arterial stenosis.  Will schedule her to have carotic ultrasound. Essential hypertension blood pressure not  well-controlled.  I will ask her to have Chem-7 done if Chem-7 is fine we will increase dose of Vasotec. Dyslipidemia I did not see any recent fasting lipid profile done.  Will schedule her to have the test done. Cardiovascular preop evaluation with this complex clinical scenario stress test will be done to rule out any inducible ischemia.   Medication Adjustments/Labs and Tests Ordered: Current medicines are reviewed at length with the patient today.  Concerns regarding medicines are outlined above.  Orders Placed This Encounter  Procedures   EKG 12-Lead   Medication changes: No orders of the defined types were placed in this encounter.   Signed, Georgeanna Lea, MD, [facility] 01/26/2023 3:56 PM    [facility]

## 2023-01-26 NOTE — Addendum Note (Signed)
Addended by: Baldo Ash D on: 01/26/2023 04:15 PM   Modules accepted: Orders

## 2023-01-26 NOTE — Patient Instructions (Signed)
Medication Instructions:  Your physician recommends that you continue on your current medications as directed. Please refer to the Current Medication list given to you today.  *If you need a refill on your cardiac medications before your next appointment, please call your pharmacy*   Lab Work: LDL direct, BMP-today If you have labs (blood work) drawn today and your tests are completely normal, you will receive your results only by: MyChart Message (if you have MyChart) OR A paper copy in the mail If you have any lab test that is abnormal or we need to change your treatment, we will call you to review the results.   Testing/Procedures:  Your physician has requested that you have a carotid duplex. This test is an ultrasound of the carotid arteries in your neck. It looks at blood flow through these arteries that supply the brain with blood. Allow one hour for this exam. There are no restrictions or special instructions.   Your physician has requested that you have a lexiscan myoview. For further information please visit [email or web address] Please follow instruction sheet, as given.  The test will take approximately 3 to 4 hours to complete; you may bring reading material.  If someone comes with you to your appointment, they will need to remain in the main lobby due to limited space in the testing area.  How to prepare for your Myocardial Perfusion Test: Do not eat or drink 3 hours prior to your test, except you may have water. Do not consume products containing caffeine (regular or decaffeinated) 12 hours prior to your test. (ex: coffee, chocolate, sodas, tea). Do bring a list of your current medications with you.  If not listed below, you may take your medications as normal. Do wear comfortable clothes (no dresses or overalls) and walking shoes, tennis shoes preferred (No heels or open toe shoes are allowed). Do NOT wear cologne, perfume, aftershave, or lotions (deodorant is allowed). If  these instructions are not followed, your test will have to be rescheduled.     Follow-Up: At [facility], you and your health needs are our priority.  As part of our continuing mission to provide you with exceptional heart care, we have created designated Provider Care Teams.  These Care Teams include your primary Cardiologist (physician) and Advanced Practice Providers (APPs -  Physician Assistants and Nurse Practitioners) who all work together to provide you with the care you need, when you need it.  We recommend signing up for the patient portal called "MyChart".  Sign up information is provided on this After Visit Summary.  MyChart is used to connect with patients for Virtual Visits (Telemedicine).  Patients are able to view lab/test results, encounter notes, upcoming appointments, etc.  Non-urgent messages can be sent to your provider as well.   To learn more about what you can do with MyChart, go to [email or web address].    Your next appointment:   6 month(s)  The format for your next appointment:   In Person  Provider:   Gypsy Balsam, MD    Other Instructions NA

## 2023-01-27 LAB — BASIC METABOLIC PANEL
BUN/Creatinine Ratio: 13 (ref 12–28)
BUN: 10 mg/dL (ref 8–27)
CO2: 24 mmol/L (ref 20–29)
Calcium: 10.1 mg/dL (ref 8.7–10.3)
Chloride: 101 mmol/L (ref 96–106)
Creatinine, Ser: 0.77 mg/dL (ref 0.57–1.00)
Glucose: 86 mg/dL (ref 70–99)
Potassium: 4.9 mmol/L (ref 3.5–5.2)
Sodium: 142 mmol/L (ref 134–144)
eGFR: 77 mL/min/{1.73_m2} (ref >59)

## 2023-01-27 LAB — LDL CHOLESTEROL, DIRECT: LDL Direct: 83 mg/dL (ref 0–99)

## 2023-02-01 ENCOUNTER — Telehealth: Payer: Self-pay | Admitting: Cardiology

## 2023-02-01 NOTE — Telephone Encounter (Signed)
   Pre-operative Risk Assessment    Patient Name: Theresa Prince  DOB: [birth year] MRN: [id number]      Request for Surgical Clearance    Procedure:  Wide Local incision of left vulvar with possible biopsies  Date of Surgery:  03-02-23                      Surgeon:  Dr Amparo Bristol Surgeon's Group or Practice Name:   Phone number:  [phone] Fax number:  [phone]   Type of Clearance Requested:   - Medical    Type of Anesthesia:  General    Additional requests/questions:    Signed, Laurence Ferrari   02/01/2023, 11:58 AM

## 2023-02-01 NOTE — Telephone Encounter (Signed)
Patient is requesting to speak with a nurse in regards to her upcoming test. Please advise.

## 2023-02-01 NOTE — Telephone Encounter (Signed)
Spoke with pt regarding stress test instructions. Pt verbalized understanding and had no further questions.

## 2023-02-02 ENCOUNTER — Telehealth: Payer: Self-pay

## 2023-02-02 DIAGNOSIS — E785 Hyperlipidemia, unspecified: Secondary | ICD-10-CM

## 2023-02-02 MED ORDER — ROSUVASTATIN CALCIUM 40 MG PO TABS: 40.0000 mg | ORAL_TABLET | Freq: Every day | ORAL | 3 refills | Status: AC

## 2023-02-02 NOTE — Telephone Encounter (Signed)
Spoke with the patient, detailed instructions given. She stated she would here for her test. Asked to call back with any questions. S.[place]

## 2023-02-02 NOTE — Telephone Encounter (Signed)
Results reviewed with pt as per Dr. Vanetta Shawl note. Pt agreed to increase Crestor to 40mg  daily and recheck bloodwork in 6 weeks.  Pt verbalized understanding and had no additional questions. Routed to PCP

## 2023-02-09 ENCOUNTER — Ambulatory Visit: Payer: PPO | Attending: Cardiology

## 2023-02-09 DIAGNOSIS — Z01810 Encounter for preprocedural cardiovascular examination: Secondary | ICD-10-CM

## 2023-02-09 MED ORDER — REGADENOSON 0.4 MG/5ML IV SOLN
0.4000 mg | Freq: Once | INTRAVENOUS | Status: AC
Start: 2023-02-09 — End: 2023-02-09
  Administered 2023-02-09: 0.4 mg via INTRAVENOUS

## 2023-02-09 MED ORDER — TECHNETIUM TC 99M TETROFOSMIN IV KIT
8.1000 | PACK | Freq: Once | INTRAVENOUS | Status: AC | PRN
  Administered 2023-02-09: 8.1 via INTRAVENOUS

## 2023-02-09 MED ORDER — TECHNETIUM TC 99M TETROFOSMIN IV KIT
23.8000 | PACK | Freq: Once | INTRAVENOUS | Status: AC | PRN
  Administered 2023-02-09: 23.8 via INTRAVENOUS

## 2023-02-10 LAB — MYOCARDIAL PERFUSION IMAGING
LV dias vol: 56 mL (ref 46–106)
LV sys vol: 14 mL
Nuc Stress EF: 76 %
Peak HR: 70 {beats}/min
Rest HR: 64 {beats}/min
Rest Nuclear Isotope Dose: 8.1 mCi
SDS: 0
SRS: 1
SSS: 1
Stress Nuclear Isotope Dose: 23.8 mCi
TID: 1.03

## 2023-02-12 ENCOUNTER — Telehealth: Payer: Self-pay

## 2023-02-12 NOTE — Telephone Encounter (Signed)
Stress Test Results reviewed with pt as per Dr. Wendy Poet note.  Pt verbalized understanding and had no additional questions. Routed to PCP

## 2023-02-12 NOTE — Telephone Encounter (Signed)
   Name: Theresa Prince  DOB: 10/15/1940  MRN: [id number]   Primary Cardiologist: Gypsy Balsam, MD  Chart reviewed as part of pre-operative protocol coverage. Inanna Gutman was last seen on 01/26/2023 by Dr. Bing Matter.  She had no cardiac complaints at that time but was not very active. Nuclear stress test was ordered for further risk stratification and was found to be a normal, low risk study.   Therefore, based on ACC/AHA guidelines, the patient would be at acceptable risk for the planned procedure without further cardiovascular testing.   I will route this recommendation to the requesting party via Epic fax function and remove from pre-op pool. Please call with questions.  Carlos Levering, NP 02/12/2023, 9:24 AM

## 2023-03-16 ENCOUNTER — Ambulatory Visit: Payer: PPO

## 2023-04-13 ENCOUNTER — Ambulatory Visit: Payer: PPO | Attending: Cardiology

## 2023-04-13 DIAGNOSIS — Z8673 Personal history of transient ischemic attack (TIA), and cerebral infarction without residual deficits: Secondary | ICD-10-CM | POA: Diagnosis not present

## 2023-04-13 DIAGNOSIS — I739 Peripheral vascular disease, unspecified: Secondary | ICD-10-CM | POA: Diagnosis not present

## 2023-04-19 ENCOUNTER — Telehealth: Payer: Self-pay

## 2023-04-19 NOTE — Telephone Encounter (Signed)
Carotid US Results reviewed with pt as per Dr. Krasowski's note.  Pt verbalized understanding and had no additional questions. Routed to PCP  

## 2024-02-10 ENCOUNTER — Other Ambulatory Visit: Payer: Self-pay | Admitting: Cardiology
